# Patient Record
Sex: Female | Born: 1985 | Race: White | Hispanic: No | Marital: Married | Smoking: Never smoker
Health system: Southern US, Community
[De-identification: ages and names within clinical notes are randomized; demographics above are authoritative.]

## PROBLEM LIST (undated history)

## (undated) ENCOUNTER — Inpatient Hospital Stay: Payer: Self-pay

## (undated) DIAGNOSIS — N83201 Unspecified ovarian cyst, right side: Secondary | ICD-10-CM

## (undated) HISTORY — DX: Unspecified ovarian cyst, right side: N83.201

---

## 2011-07-21 ENCOUNTER — Encounter (HOSPITAL_COMMUNITY): Payer: Self-pay | Admitting: Anesthesiology

## 2011-07-21 ENCOUNTER — Encounter (HOSPITAL_COMMUNITY)
Admission: AD | Disposition: A | Discharge status: Home / Self Care | Payer: Self-pay | Source: Ambulatory Visit | Attending: Obstetrics and Gynecology

## 2011-07-21 ENCOUNTER — Inpatient Hospital Stay (HOSPITAL_COMMUNITY): Payer: BC Managed Care – PPO | Admitting: Anesthesiology

## 2011-07-21 ENCOUNTER — Encounter (HOSPITAL_COMMUNITY): Payer: Self-pay | Admitting: *Deleted

## 2011-07-21 ENCOUNTER — Inpatient Hospital Stay (HOSPITAL_COMMUNITY)
Admission: AD | Admit: 2011-07-21 | Discharge: 2011-07-24 | DRG: 371 | Disposition: A | Discharge status: Home / Self Care | Payer: BC Managed Care – PPO | Source: Ambulatory Visit | Attending: Obstetrics and Gynecology | Admitting: Obstetrics and Gynecology

## 2011-07-21 DIAGNOSIS — Z98891 History of uterine scar from previous surgery: Secondary | ICD-10-CM

## 2011-07-21 DIAGNOSIS — O99892 Other specified diseases and conditions complicating childbirth: Secondary | ICD-10-CM | POA: Diagnosis present

## 2011-07-21 DIAGNOSIS — O324XX0 Maternal care for high head at term, not applicable or unspecified: Secondary | ICD-10-CM | POA: Diagnosis present

## 2011-07-21 DIAGNOSIS — Z22330 Carrier of Group B streptococcus: Secondary | ICD-10-CM

## 2011-07-21 LAB — CBC
HCT: 35.8 % — ABNORMAL LOW (ref 36.0–46.0)
Hemoglobin: 11.6 g/dL — ABNORMAL LOW (ref 12.0–15.0)
MCHC: 32.4 g/dL (ref 30.0–36.0)
RBC: 4.32 MIL/uL (ref 3.87–5.11)

## 2011-07-21 LAB — ANTIBODY SCREEN: Antibody Screen: NEGATIVE

## 2011-07-21 LAB — RUBELLA ANTIBODY, IGM: Rubella: IMMUNE

## 2011-07-21 LAB — HEPATITIS B SURFACE ANTIGEN: Hepatitis B Surface Ag: NEGATIVE

## 2011-07-21 SURGERY — Surgical Case
Anesthesia: Regional | Site: Abdomen | Wound class: Clean Contaminated

## 2011-07-21 MED ORDER — IBUPROFEN 600 MG PO TABS: 600.0000 mg | ORAL_TABLET | Freq: Four times a day (QID) | ORAL | Status: DC | PRN

## 2011-07-21 MED ORDER — LACTATED RINGERS IV SOLN
INTRAVENOUS | Status: DC
  Administered 2011-07-21: 05:00:00 via INTRAVENOUS

## 2011-07-21 MED ORDER — OXYTOCIN 20 UNITS IN LACTATED RINGERS INFUSION - SIMPLE
1.0000 m[IU]/min | INTRAVENOUS | Status: DC
  Administered 2011-07-21: 2 m[IU]/min via INTRAVENOUS

## 2011-07-21 MED ORDER — KETOROLAC TROMETHAMINE 30 MG/ML IJ SOLN: 30.0000 mg | Freq: Four times a day (QID) | INTRAMUSCULAR | Status: AC | PRN

## 2011-07-21 MED ORDER — FENTANYL CITRATE 0.05 MG/ML IJ SOLN
INTRAMUSCULAR | Status: AC
Start: 2011-07-21 — End: 2011-07-21
  Filled 2011-07-21: qty 2

## 2011-07-21 MED ORDER — NALBUPHINE HCL 10 MG/ML IJ SOLN
5.0000 mg | INTRAMUSCULAR | Status: DC | PRN
  Filled 2011-07-21: qty 1

## 2011-07-21 MED ORDER — SENNOSIDES-DOCUSATE SODIUM 8.6-50 MG PO TABS
2.0000 | ORAL_TABLET | Freq: Every day | ORAL | Status: DC
  Administered 2011-07-23: 2 via ORAL

## 2011-07-21 MED ORDER — IBUPROFEN 600 MG PO TABS
600.0000 mg | ORAL_TABLET | Freq: Four times a day (QID) | ORAL | Status: DC | PRN
  Filled 2011-07-21 (×6): qty 1

## 2011-07-21 MED ORDER — MORPHINE SULFATE 0.5 MG/ML IJ SOLN
INTRAMUSCULAR | Status: AC
  Filled 2011-07-21: qty 10

## 2011-07-21 MED ORDER — ONDANSETRON HCL 4 MG/2ML IJ SOLN
4.0000 mg | INTRAMUSCULAR | Status: DC | PRN
  Administered 2011-07-21: 4 mg via INTRAVENOUS
  Filled 2011-07-21: qty 2

## 2011-07-21 MED ORDER — MAGNESIUM HYDROXIDE 400 MG/5ML PO SUSP: 30.0000 mL | ORAL | Status: DC | PRN

## 2011-07-21 MED ORDER — DIPHENHYDRAMINE HCL 50 MG/ML IJ SOLN: 12.5000 mg | INTRAMUSCULAR | Status: DC | PRN

## 2011-07-21 MED ORDER — LACTATED RINGERS IV SOLN
500.0000 mL | INTRAVENOUS | Status: DC | PRN
  Administered 2011-07-21: 1000 mL via INTRAVENOUS

## 2011-07-21 MED ORDER — OXYTOCIN 20 UNITS IN LACTATED RINGERS INFUSION - SIMPLE
125.0000 mL/h | INTRAVENOUS | Status: AC
  Administered 2011-07-21: 125 mL/h via INTRAVENOUS

## 2011-07-21 MED ORDER — OXYTOCIN 20 UNITS IN LACTATED RINGERS INFUSION - SIMPLE
INTRAVENOUS | Status: AC
  Filled 2011-07-21: qty 1000

## 2011-07-21 MED ORDER — KETOROLAC TROMETHAMINE 60 MG/2ML IM SOLN
60.0000 mg | Freq: Once | INTRAMUSCULAR | Status: AC | PRN
  Administered 2011-07-21: 60 mg via INTRAMUSCULAR

## 2011-07-21 MED ORDER — OXYCODONE-ACETAMINOPHEN 5-325 MG PO TABS
1.0000 | ORAL_TABLET | ORAL | Status: DC | PRN
  Administered 2011-07-22: 1 via ORAL
  Filled 2011-07-21 (×2): qty 1

## 2011-07-21 MED ORDER — PROMETHAZINE HCL 25 MG/ML IJ SOLN: 6.2500 mg | INTRAMUSCULAR | Status: DC | PRN

## 2011-07-21 MED ORDER — ONDANSETRON HCL 4 MG/2ML IJ SOLN: 4.0000 mg | Freq: Four times a day (QID) | INTRAMUSCULAR | Status: DC | PRN

## 2011-07-21 MED ORDER — MEPERIDINE HCL 25 MG/ML IJ SOLN: 6.2500 mg | INTRAMUSCULAR | Status: DC | PRN

## 2011-07-21 MED ORDER — CEFAZOLIN SODIUM 1-5 GM-% IV SOLN
INTRAVENOUS | Status: DC | PRN
  Administered 2011-07-21: 1 g via INTRAVENOUS

## 2011-07-21 MED ORDER — MORPHINE SULFATE (PF) 0.5 MG/ML IJ SOLN
INTRAMUSCULAR | Status: DC | PRN
  Administered 2011-07-21: 200 ug via INTRATHECAL

## 2011-07-21 MED ORDER — ONDANSETRON HCL 4 MG/2ML IJ SOLN
INTRAMUSCULAR | Status: DC | PRN
  Administered 2011-07-21: 4 mg via INTRAVENOUS

## 2011-07-21 MED ORDER — OXYCODONE-ACETAMINOPHEN 5-325 MG PO TABS: 2.0000 | ORAL_TABLET | ORAL | Status: DC | PRN

## 2011-07-21 MED ORDER — PENICILLIN G POTASSIUM 5000000 UNITS IJ SOLR
5.0000 10*6.[IU] | Freq: Once | INTRAVENOUS | Status: AC
  Administered 2011-07-21: 5 10*6.[IU] via INTRAVENOUS
  Filled 2011-07-21: qty 5

## 2011-07-21 MED ORDER — KETOROLAC TROMETHAMINE 30 MG/ML IJ SOLN: 15.0000 mg | Freq: Once | INTRAMUSCULAR | Status: DC | PRN

## 2011-07-21 MED ORDER — MEASLES, MUMPS & RUBELLA VAC ~~LOC~~ INJ
0.5000 mL | INJECTION | Freq: Once | SUBCUTANEOUS | Status: DC
  Filled 2011-07-21: qty 0.5

## 2011-07-21 MED ORDER — LIDOCAINE HCL (PF) 1 % IJ SOLN
30.0000 mL | INTRAMUSCULAR | Status: DC | PRN
  Filled 2011-07-21: qty 30

## 2011-07-21 MED ORDER — SIMETHICONE 80 MG PO CHEW: 80.0000 mg | CHEWABLE_TABLET | ORAL | Status: DC | PRN

## 2011-07-21 MED ORDER — BUPIVACAINE IN DEXTROSE 0.75-8.25 % IT SOLN
INTRATHECAL | Status: DC | PRN
  Administered 2011-07-21: 1.6 mL via INTRATHECAL

## 2011-07-21 MED ORDER — DIBUCAINE 1 % RE OINT: 1.0000 "application " | TOPICAL_OINTMENT | RECTAL | Status: DC | PRN

## 2011-07-21 MED ORDER — PHENYLEPHRINE 40 MCG/ML (10ML) SYRINGE FOR IV PUSH (FOR BLOOD PRESSURE SUPPORT)
PREFILLED_SYRINGE | INTRAVENOUS | Status: AC
  Filled 2011-07-21: qty 5

## 2011-07-21 MED ORDER — METOCLOPRAMIDE HCL 5 MG/ML IJ SOLN
10.0000 mg | Freq: Three times a day (TID) | INTRAMUSCULAR | Status: DC | PRN
  Administered 2011-07-21: 10 mg via INTRAVENOUS
  Filled 2011-07-21: qty 2

## 2011-07-21 MED ORDER — ONDANSETRON HCL 4 MG/2ML IJ SOLN: 4.0000 mg | Freq: Three times a day (TID) | INTRAMUSCULAR | Status: DC | PRN

## 2011-07-21 MED ORDER — ONDANSETRON HCL 4 MG/2ML IJ SOLN
INTRAMUSCULAR | Status: AC
  Filled 2011-07-21: qty 2

## 2011-07-21 MED ORDER — ONDANSETRON HCL 4 MG PO TABS: 4.0000 mg | ORAL_TABLET | ORAL | Status: DC | PRN

## 2011-07-21 MED ORDER — PENICILLIN G POTASSIUM 5000000 UNITS IJ SOLR
2.5000 10*6.[IU] | INTRAVENOUS | Status: DC
  Filled 2011-07-21 (×3): qty 2.5

## 2011-07-21 MED ORDER — NALOXONE HCL 0.4 MG/ML IJ SOLN: 0.4000 mg | INTRAMUSCULAR | Status: DC | PRN

## 2011-07-21 MED ORDER — NALOXONE HCL 0.4 MG/ML IJ SOLN
INTRAMUSCULAR | Status: AC
  Filled 2011-07-21: qty 1

## 2011-07-21 MED ORDER — DIPHENHYDRAMINE HCL 50 MG/ML IJ SOLN: 25.0000 mg | INTRAMUSCULAR | Status: DC | PRN

## 2011-07-21 MED ORDER — TETANUS-DIPHTH-ACELL PERTUSSIS 5-2.5-18.5 LF-MCG/0.5 IM SUSP: 0.5000 mL | Freq: Once | INTRAMUSCULAR | Status: DC

## 2011-07-21 MED ORDER — OXYTOCIN 20 UNITS IN LACTATED RINGERS INFUSION - SIMPLE
INTRAVENOUS | Status: DC | PRN
  Administered 2011-07-21: 20 [IU] via INTRAVENOUS

## 2011-07-21 MED ORDER — SIMETHICONE 80 MG PO CHEW
80.0000 mg | CHEWABLE_TABLET | Freq: Three times a day (TID) | ORAL | Status: DC
  Administered 2011-07-23 – 2011-07-24 (×3): 80 mg via ORAL

## 2011-07-21 MED ORDER — TERBUTALINE SULFATE 1 MG/ML IJ SOLN: 0.2500 mg | Freq: Once | INTRAMUSCULAR | Status: DC | PRN

## 2011-07-21 MED ORDER — WITCH HAZEL-GLYCERIN EX PADS
1.0000 | MEDICATED_PAD | CUTANEOUS | Status: DC | PRN
Start: 2011-07-21 — End: 2011-07-24

## 2011-07-21 MED ORDER — FLEET ENEMA 7-19 GM/118ML RE ENEM: 1.0000 | ENEMA | RECTAL | Status: DC | PRN

## 2011-07-21 MED ORDER — MENTHOL 3 MG MT LOZG: 1.0000 | LOZENGE | OROMUCOSAL | Status: DC | PRN

## 2011-07-21 MED ORDER — DIPHENHYDRAMINE HCL 25 MG PO CAPS: 25.0000 mg | ORAL_CAPSULE | Freq: Four times a day (QID) | ORAL | Status: DC | PRN

## 2011-07-21 MED ORDER — BUTORPHANOL TARTRATE 2 MG/ML IJ SOLN: 1.0000 mg | INTRAMUSCULAR | Status: DC | PRN

## 2011-07-21 MED ORDER — DIPHENHYDRAMINE HCL 25 MG PO CAPS: 25.0000 mg | ORAL_CAPSULE | ORAL | Status: DC | PRN

## 2011-07-21 MED ORDER — KETOROLAC TROMETHAMINE 60 MG/2ML IM SOLN
INTRAMUSCULAR | Status: AC
  Administered 2011-07-21: 60 mg via INTRAMUSCULAR
  Filled 2011-07-21: qty 2

## 2011-07-21 MED ORDER — PRENATAL MULTIVITAMIN CH
1.0000 | ORAL_TABLET | Freq: Every day | ORAL | Status: DC
  Administered 2011-07-22 – 2011-07-24 (×3): 1 via ORAL
  Filled 2011-07-21 (×3): qty 1

## 2011-07-21 MED ORDER — SODIUM CHLORIDE 0.9 % IJ SOLN: 3.0000 mL | INTRAMUSCULAR | Status: DC | PRN

## 2011-07-21 MED ORDER — SODIUM CHLORIDE 0.9 % IV SOLN: 1.0000 ug/kg/h | INTRAVENOUS | Status: DC | PRN

## 2011-07-21 MED ORDER — OXYTOCIN 10 UNIT/ML IJ SOLN
INTRAMUSCULAR | Status: AC
  Filled 2011-07-21: qty 2

## 2011-07-21 MED ORDER — OXYTOCIN BOLUS FROM INFUSION
500.0000 mL | Freq: Once | INTRAVENOUS | Status: DC
  Filled 2011-07-21: qty 500

## 2011-07-21 MED ORDER — CITRIC ACID-SODIUM CITRATE 334-500 MG/5ML PO SOLN
30.0000 mL | ORAL | Status: DC | PRN
  Administered 2011-07-21: 30 mL via ORAL
  Filled 2011-07-21 (×2): qty 15

## 2011-07-21 MED ORDER — FENTANYL CITRATE 0.05 MG/ML IJ SOLN
INTRAMUSCULAR | Status: DC | PRN
  Administered 2011-07-21: 25 ug via INTRATHECAL

## 2011-07-21 MED ORDER — SCOPOLAMINE 1 MG/3DAYS TD PT72
1.0000 | MEDICATED_PATCH | Freq: Once | TRANSDERMAL | Status: AC
  Administered 2011-07-21: 1.5 mg via TRANSDERMAL

## 2011-07-21 MED ORDER — ACETAMINOPHEN 325 MG PO TABS: 650.0000 mg | ORAL_TABLET | ORAL | Status: DC | PRN

## 2011-07-21 MED ORDER — OXYTOCIN 20 UNITS IN LACTATED RINGERS INFUSION - SIMPLE
125.0000 mL/h | Freq: Once | INTRAVENOUS | Status: DC
  Filled 2011-07-21: qty 1000

## 2011-07-21 MED ORDER — HYDROMORPHONE HCL PF 1 MG/ML IJ SOLN: 0.2500 mg | INTRAMUSCULAR | Status: DC | PRN

## 2011-07-21 MED ORDER — LACTATED RINGERS IV SOLN
INTRAVENOUS | Status: DC | PRN
  Administered 2011-07-21 (×3): via INTRAVENOUS

## 2011-07-21 MED ORDER — ZOLPIDEM TARTRATE 5 MG PO TABS: 5.0000 mg | ORAL_TABLET | Freq: Every evening | ORAL | Status: DC | PRN

## 2011-07-21 MED ORDER — IBUPROFEN 600 MG PO TABS
600.0000 mg | ORAL_TABLET | Freq: Four times a day (QID) | ORAL | Status: DC
  Administered 2011-07-22 – 2011-07-24 (×10): 600 mg via ORAL
  Filled 2011-07-21 (×5): qty 1

## 2011-07-21 MED ORDER — SCOPOLAMINE 1 MG/3DAYS TD PT72
MEDICATED_PATCH | TRANSDERMAL | Status: AC
  Administered 2011-07-21: 1.5 mg via TRANSDERMAL
  Filled 2011-07-21: qty 1

## 2011-07-21 MED ORDER — LACTATED RINGERS IV SOLN
INTRAVENOUS | Status: DC
  Administered 2011-07-21: 21:00:00 via INTRAVENOUS

## 2011-07-21 MED ORDER — PHENYLEPHRINE HCL 10 MG/ML IJ SOLN
INTRAMUSCULAR | Status: DC | PRN
  Administered 2011-07-21 (×2): 80 ug via INTRAVENOUS

## 2011-07-21 MED ORDER — 0.9 % SODIUM CHLORIDE (POUR BTL) OPTIME
TOPICAL | Status: DC | PRN
  Administered 2011-07-21: 1000 mL

## 2011-07-21 MED ORDER — LANOLIN HYDROUS EX OINT
1.0000 | TOPICAL_OINTMENT | CUTANEOUS | Status: DC | PRN
Start: 2011-07-21 — End: 2011-07-24

## 2011-07-21 MED ORDER — CEFAZOLIN SODIUM 1-5 GM-% IV SOLN
INTRAVENOUS | Status: AC
  Filled 2011-07-21: qty 50

## 2011-07-21 SURGICAL SUPPLY — 37 items
BENZOIN TINCTURE PRP APPL 2/3 (GAUZE/BANDAGES/DRESSINGS) ×2 IMPLANT
CHLORAPREP W/TINT 26ML (MISCELLANEOUS) ×2 IMPLANT
CLOSURE STERI STRIP 1/2 X4 (GAUZE/BANDAGES/DRESSINGS) ×2 IMPLANT
CLOTH BEACON ORANGE TIMEOUT ST (SAFETY) ×2 IMPLANT
CONTAINER PREFILL 10% NBF 15ML (MISCELLANEOUS) IMPLANT
DRSG COVADERM 4X10 (GAUZE/BANDAGES/DRESSINGS) ×2 IMPLANT
ELECT REM PT RETURN 9FT ADLT (ELECTROSURGICAL) ×2
ELECTRODE REM PT RTRN 9FT ADLT (ELECTROSURGICAL) ×1 IMPLANT
EXTRACTOR VACUUM KIWI (MISCELLANEOUS) IMPLANT
EXTRACTOR VACUUM M CUP 4 TUBE (SUCTIONS) IMPLANT
GLOVE BIO SURGEON STRL SZ8 (GLOVE) ×2 IMPLANT
GLOVE BIOGEL PI IND STRL 7.5 (GLOVE) ×2 IMPLANT
GLOVE BIOGEL PI INDICATOR 7.5 (GLOVE) ×2
GLOVE ECLIPSE 6.5 STRL STRAW (GLOVE) ×2 IMPLANT
GLOVE ECLIPSE 7.0 STRL STRAW (GLOVE) ×4 IMPLANT
GLOVE ORTHO TXT STRL SZ7.5 (GLOVE) ×4 IMPLANT
GOWN PREVENTION PLUS LG XLONG (DISPOSABLE) ×4 IMPLANT
GOWN SURGICAL XLG (GOWNS) ×4 IMPLANT
KIT ABG SYR 3ML LUER SLIP (SYRINGE) IMPLANT
NEEDLE HYPO 25X5/8 SAFETYGLIDE (NEEDLE) ×2 IMPLANT
NS IRRIG 1000ML POUR BTL (IV SOLUTION) ×2 IMPLANT
PACK C SECTION WH (CUSTOM PROCEDURE TRAY) ×2 IMPLANT
RTRCTR C-SECT PINK 25CM LRG (MISCELLANEOUS) ×2 IMPLANT
SLEEVE SCD COMPRESS KNEE MED (MISCELLANEOUS) ×2 IMPLANT
STAPLER VISISTAT 35W (STAPLE) IMPLANT
STRIP CLOSURE SKIN 1/2X4 (GAUZE/BANDAGES/DRESSINGS) ×2 IMPLANT
SUT CHROMIC 1 CTX 36 (SUTURE) ×4 IMPLANT
SUT PLAIN 0 NONE (SUTURE) IMPLANT
SUT PLAIN 2 0 XLH (SUTURE) ×2 IMPLANT
SUT PROLENE 4 0 KS NEEDLE (SUTURE) IMPLANT
SUT VIC AB 0 CT1 27 (SUTURE) ×3
SUT VIC AB 0 CT1 27XBRD ANBCTR (SUTURE) ×3 IMPLANT
SUT VIC AB 4-0 KS 27 (SUTURE) ×2 IMPLANT
SWABSTICK BENZOIN STERILE (MISCELLANEOUS) ×2 IMPLANT
TOWEL OR 17X24 6PK STRL BLUE (TOWEL DISPOSABLE) ×4 IMPLANT
TRAY FOLEY CATH 14FR (SET/KITS/TRAYS/PACK) ×2 IMPLANT
WATER STERILE IRR 1000ML POUR (IV SOLUTION) ×2 IMPLANT

## 2011-07-21 NOTE — Consult Note (Signed)
Neonatology Note:   Attendance at C-section:    I was asked to attend this primary C/S at term due to failure of descent. The mother is a G2P0A1 A pos, GBS positive who has remained afebrile during labor and received 2 doses of Pen G prior to delivery. ROM about 6 hours prior to delivery, fluid clear. Infant vigorous with good spontaneous cry and tone. Needed only minimal bulb suctioning. Ap 8/9. Lungs clear to ausc in DR. To CN to care of Pediatrician.   Deatra James, MD

## 2011-07-21 NOTE — Anesthesia Postprocedure Evaluation (Signed)
Anesthesia Post Note  Patient: Jessica Frederick  Procedure(s) Performed:  CESAREAN SECTION - Primary cesarean section  Anesthesia type: Spinal  Patient location: PACU  Post pain: Pain level controlled  Post assessment: Post-op Vital signs reviewed  Last Vitals:  Filed Vitals:   07/21/11 1230  BP: 90/52  Pulse: 67  Temp:   Resp: 15    Post vital signs: Reviewed  Level of consciousness: awake  Complications: No apparent anesthesia complications

## 2011-07-21 NOTE — Progress Notes (Signed)
Pushing for about 2 hrs, getting tired Afeb, VSS FHT- Cat II, variables with ctx VE- C/C/+1, caput at +2, vtx Discussed that vtx has not descended enough for me to offer assistance, has been pushing for 2 hrs, recommended c-section for arrest of descent.  Discussed c-section procedure and risks.  Will do VE after has spinal to see if assisted vaginal delivery is an option.

## 2011-07-21 NOTE — Anesthesia Procedure Notes (Signed)
Spinal  Patient location during procedure: OR Start time: 07/21/2011 10:54 AM End time: 07/21/2011 10:58 AM Staffing Anesthesiologist: Sandrea Hughs Performed by: anesthesiologist  Preanesthetic Checklist Completed: patient identified, site marked, surgical consent, pre-op evaluation, timeout performed, IV checked, risks and benefits discussed and monitors and equipment checked Spinal Block Patient position: sitting Prep: DuraPrep Patient monitoring: heart rate, cardiac monitor, continuous pulse ox and blood pressure Approach: midline Location: L3-4 Injection technique: single-shot Needle Needle type: Sprotte  Needle gauge: 24 G Needle length: 9 cm Needle insertion depth: 8 cm Assessment Sensory level: T4

## 2011-07-21 NOTE — H&P (Signed)
Jessica Frederick is a 25 y.o. female G2P0010 at 39+ weeks (EDD 07/28/11 by 9 week [place]) presenting for SROM at 0330 am and started on pitocin for augmentation.  Prenatal care uneventful except for GBS positive.  Maternal Medical History:  Reason for admission: Reason for admission: rupture of membranes.  Contractions: Onset was 3-5 hours ago.   Frequency: regular.   Perceived severity is moderate.    Fetal activity: Perceived fetal activity is normal.      Past Ob HX SAB x 1  History reviewed. No pertinent past medical history. History reviewed. No pertinent past surgical history. Family History: family history is not on file. Social History:  reports that she has never smoked. She does not have any smokeless tobacco history on file. She reports that she does not drink alcohol or use illicit drugs.  ROS  Dilation: 4.5 Effacement (%): 90 Station: -1 Exam by:: Dr. Senaida Ores Blood pressure 127/85, pulse 97, temperature 97.8 F (36.6 C), temperature source Oral, resp. rate 18, height 5' (1.524 m), weight 94.348 kg (208 lb). Maternal Exam:  Uterine Assessment: Contraction strength is moderate.  Contraction frequency is regular.   Abdomen: Patient reports no abdominal tenderness. Fetal presentation: vertex  Introitus: Normal vulva. Normal vagina.    Physical Exam  Constitutional: She is oriented to person, place, and time. She appears well-developed and well-nourished.  Cardiovascular: Normal rate and regular rhythm.   Respiratory: Effort normal and breath sounds normal.  GI: Soft. Bowel sounds are normal.  Genitourinary: Vagina normal and uterus normal.       Cervix c/4-5/-1  Neurological: She is alert and oriented to person, place, and time.  Psychiatric: She has a normal mood and affect. Her behavior is normal.    Prenatal labs: ABO, Rh: A/Positive/-- (12/26 0000) Antibody: Negative (12/26 0000) Rubella: Immune (12/26 0000) RPR: Nonreactive (12/26 0000)  HBsAg: Negative  (12/26 0000)  HIV: Non-reactive (12/26 0000)  GBS: Positive (12/26 0000)  One hour glucola 126 Declined genetic screens  Assessment/Plan: Pt being augmented with pitocin, has progressed from 3cm to 4-5 cm and may do an epidural in the next hour.  On PCN for +GBS, has received first dose.  Oliver Pila 07/21/2011, 6:40 AM

## 2011-07-21 NOTE — Addendum Note (Signed)
Addendum  created 07/21/11 1939 by Tyrone Apple. Malen Gauze, MD   Modules edited:Orders

## 2011-07-21 NOTE — Progress Notes (Signed)
Pt may go to room 173.

## 2011-07-21 NOTE — Progress Notes (Signed)
Pt presents to mau with c/o ROM.  States large gush of fluid at home, clear.  Denies any bleeding.

## 2011-07-21 NOTE — Anesthesia Postprocedure Evaluation (Signed)
  Anesthesia Post Note  Patient: Jessica Frederick  Procedure(s) Performed:  CESAREAN SECTION - Primary cesarean section  Anesthesia type: Spinal  Patient location: Mother/Baby  Post pain: Pain level controlled  Post assessment: Post-op Vital signs reviewed  Last Vitals:  Filed Vitals:   07/21/11 1606  BP: 111/72  Pulse: 74  Temp: 36.7 C  Resp: 16    Post vital signs: Reviewed  Level of consciousness: awake  Complications: No apparent anesthesia complications

## 2011-07-21 NOTE — Op Note (Signed)
Preoperative diagnosis: Intrauterine pregnancy at 39 weeks, arrest of descent Postoperative diagnosis: Same Procedure: Primary low transverse cesarean section without extensions Surgeon: Lavina Hamman M.D. Anesthesia: Epidural Findings: Patient had normal gravid anatomy and delivered a viable female infant with Apgars of 8 and 9 weighing 7 lbs. 6 oz. Estimated blood loss: 800 cc Specimens: Placenta sent to labor and delivery Complications: None  Procedure in detail: The patient was taken to the operating room and placed in the sitting position. Dr. Arby Barrette instilled spinal anesthesia. Abdomen was then prepped and draped in the usual sterile fashion. A Foley catheter was placed. The level of her anesthesia was found to be adequate. Abdomen was entered via a standard Pfannenstiel incision. Once the peritoneal cavity was entered the Alexis disposable self-retaining retractor was placed and good visualization was achieved. A 4 cm transverse incision was then made in the lower uterine segment pushing the bladder inferior. Once the uterine cavity was entered the incision was extended digitally. The fetal vertex was grasped and delivered through the incision atraumatically. Mouth and nares were suctioned. The remainder of the infant then delivered atraumatically. Cord was doubly clamped and cut and the infant handed to the awaiting pediatric team. Cord blood was obtained. The placenta was removed manually. Uterus was wiped dry with clean lap pad and all clots and debris were removed. Uterine incision was inspected and found to be free of extensions. Uterine incision was closed in 2 layers with the first layer being a running locking layer with #1 chromic and the second layer being an imbricating layer also with #1 chromic. Tubes and ovaries were inspected and found to be normal. Uterine incision was inspected and found to be hemostatic. Bleeding from serosal edges was controlled with electrocautery. The Alexis  retractor was removed. Subfascial space was irrigated and made hemostatic with electrocautery. Fascia was closed in running fashion starting at both ends and meeting in the middle with 0 Vicryl. Subcutaneous tissue was then irrigated and made hemostatic with electrocautery. Subcutaneous tissue was closed with running 2-0 plain gut.  Skin was closed with running subcuticular suture with 4-0 Vicryl followed by a sterile dressing. Patient tolerated the procedure well and was taken to the recovery in stable condition. Counts were correct x2, she received Ancef 1 g IV at the beginning of the procedure and she had PAS hose on throughout the procedure.

## 2011-07-21 NOTE — Progress Notes (Signed)
Pushing FHT- Cat II- some variable decels with ctx VE- C/C/+1 Continue pushing, anticipate SVD

## 2011-07-21 NOTE — Anesthesia Preprocedure Evaluation (Addendum)
Anesthesia Evaluation  Patient identified by MRN, date of birth, ID band Patient awake    Reviewed: Allergy & Precautions, H&P , NPO status , Patient's Chart, lab work & pertinent test results  Airway Mallampati: II TM Distance: >3 FB Neck ROM: full    Dental No notable dental hx.    Pulmonary neg pulmonary ROS,    Pulmonary exam normal       Cardiovascular neg cardio ROS regular Normal    Neuro/Psych Negative Neurological ROS  Negative Psych ROS   GI/Hepatic negative GI ROS, Neg liver ROS,   Endo/Other  Negative Endocrine ROS  Renal/GU negative Renal ROS  Genitourinary negative   Musculoskeletal negative musculoskeletal ROS (+)   Abdominal (+) obese,   Peds negative pediatric ROS (+)  Hematology negative hematology ROS (+)   Anesthesia Other Findings   Reproductive/Obstetrics (+) Pregnancy                           Anesthesia Physical Anesthesia Plan  ASA: III and Emergent  Anesthesia Plan: Spinal   Post-op Pain Management:    Induction:   Airway Management Planned:   Additional Equipment:   Intra-op Plan:   Post-operative Plan:   Informed Consent: I have reviewed the patients History and Physical, chart, labs and discussed the procedure including the risks, benefits and alternatives for the proposed anesthesia with the patient or authorized representative who has indicated his/her understanding and acceptance.     Plan Discussed with: CRNA  Anesthesia Plan Comments:        Anesthesia Quick Evaluation

## 2011-07-21 NOTE — Addendum Note (Signed)
Addendum  created 07/21/11 1625 by Cephus Shelling   Modules edited:Notes Section

## 2011-07-21 NOTE — Transfer of Care (Signed)
Immediate Anesthesia Transfer of Care Note  Patient: Jessica Frederick  Procedure(s) Performed:  CESAREAN SECTION - Primary cesarean section  Patient Location: PACU  Anesthesia Type: Regional  Level of Consciousness: awake and alert   Airway & Oxygen Therapy: Patient Spontanous Breathing  Post-op Assessment: Report given to PACU RN  Post vital signs: Reviewed and stable  Complications: No apparent anesthesia complications

## 2011-07-22 LAB — CBC
HCT: 26.4 % — ABNORMAL LOW (ref 36.0–46.0)
MCHC: 32.6 g/dL (ref 30.0–36.0)
MCV: 82.8 fL (ref 78.0–100.0)
Platelets: 184 10*3/uL (ref 150–400)
RDW: 13.5 % (ref 11.5–15.5)
WBC: 14.6 10*3/uL — ABNORMAL HIGH (ref 4.0–10.5)

## 2011-07-22 NOTE — Progress Notes (Signed)
Subjective: Postpartum Day 1: Cesarean Delivery Patient reports tolerating PO and no problems voiding.    Objective: Vital signs in last 24 hours: Temp:  [97.5 F (36.4 C)-98.7 F (37.1 C)] 98.3 F (36.8 C) (12/27 0345) Pulse Rate:  [67-90] 87  (12/27 0345) Resp:  [12-20] 18  (12/27 0345) BP: (90-128)/(46-79) 96/60 mmHg (12/27 0345) SpO2:  [95 %-97 %] 95 % (12/27 0345)  Physical Exam:  General: alert Lochia: appropriate Uterine Fundus: firm Incision: dressing C/D/I   Basename 07/22/11 0557 07/21/11 0500  HGB 8.6* 11.6*  HCT 26.4* 35.8*    Assessment/Plan: Status post Cesarean section. Doing well postoperatively.  Continue current care, ambulate.  Jessica Frederick D 07/22/2011, 8:38 AM

## 2011-07-23 NOTE — Progress Notes (Signed)
POD #2 Doing well, no problems Afeb, VSS Abd- soft, fundus firm, incision intact Continue routine care 

## 2011-07-24 ENCOUNTER — Encounter (HOSPITAL_COMMUNITY): Payer: Self-pay | Admitting: Obstetrics and Gynecology

## 2011-07-24 MED ORDER — OXYCODONE-ACETAMINOPHEN 5-325 MG PO TABS: 1.0000 | ORAL_TABLET | ORAL | Status: AC | PRN

## 2011-07-24 MED ORDER — IBUPROFEN 600 MG PO TABS: 600.0000 mg | ORAL_TABLET | Freq: Four times a day (QID) | ORAL | Status: AC | PRN

## 2011-07-24 NOTE — Progress Notes (Signed)
Subjective: Postpartum Day 3: Cesarean Delivery Patient reports tolerating PO, + flatus and no problems voiding.    Objective: Vital signs in last 24 hours: Temp:  [98.1 F (36.7 C)-98.2 F (36.8 C)] 98.2 F (36.8 C) (12/29 0535) Pulse Rate:  [98-109] 103  (12/29 0535) Resp:  [18-20] 18  (12/29 0535) BP: (112-121)/(71-82) 117/81 mmHg (12/29 0535)  Physical Exam:  General: alert Lochia: appropriate Uterine Fundus: firm Incision: healing well DVT Evaluation: No evidence of DVT seen on physical exam.   Basename 07/22/11 0557  HGB 8.6*  HCT 26.4*    Assessment/Plan: Status post Cesarean section. Doing well postoperatively.  Discharge home with standard precautions and return to office in 2 weeks for incision check. Motrin and percocet Essynce Munsch W 07/24/2011, 10:52 AM

## 2011-07-24 NOTE — Discharge Summary (Signed)
Obstetric Discharge Summary Reason for Admission: onset of labor and rupture of membranes Prenatal Procedures: none Intrapartum Procedures: cesarean: low cervical, transverse for arrest of descent Postpartum Procedures: none Complications-Operative and Postpartum: none Hemoglobin  Date Value Range Status  07/22/2011 8.6* 12.0-15.0 (g/dL) Final     DELTA CHECK NOTED     REPEATED TO VERIFY     HCT  Date Value Range Status  07/22/2011 26.4* 36.0-46.0 (%) Final    Discharge Diagnoses: Term Pregnancy-delivered  Discharge Information: Date: 07/24/2011 Activity: pelvic rest Diet: routine Medications: Ibuprofen and Percocet Condition: stable Instructions: refer to practice specific booklet Discharge to: home   Newborn Data: Live born female  Birth Weight: 7 lb 6.5 oz (3360 g) APGAR: 8, 9  Home with mother.  Oliver Pila 07/24/2011, 10:53 AM

## 2011-07-29 ENCOUNTER — Inpatient Hospital Stay (HOSPITAL_COMMUNITY): Admission: RE | Admit: 2011-07-29 | Payer: Self-pay | Source: Ambulatory Visit

## 2013-11-30 DIAGNOSIS — K219 Gastro-esophageal reflux disease without esophagitis: Secondary | ICD-10-CM | POA: Insufficient documentation

## 2014-02-18 ENCOUNTER — Ambulatory Visit: Payer: Self-pay | Admitting: Internal Medicine

## 2014-05-27 ENCOUNTER — Encounter (HOSPITAL_COMMUNITY): Payer: Self-pay | Admitting: Obstetrics and Gynecology

## 2014-09-19 LAB — HM PAP SMEAR: HM PAP: NEGATIVE

## 2014-11-10 IMAGING — CT CT ABD-PELV W/ CM
2 of 4 series · 17 of 46 positions shown, 19 images · IV contrast (omnipaque)
Comparison: None.

CLINICAL DATA: Right flank pain.  Evaluate for appendicitis

EXAM:
CT ABDOMEN AND PELVIS WITH CONTRAST
TECHNIQUE: Multidetector CT imaging of the abdomen and pelvis was performed
using the standard protocol following bolus administration of
intravenous contrast.
CONTRAST:  100 mL Omnipaque 300 IV

[Series 2: routine abd pel with · axial · 0.62mm/px · z∈[-854,-464]mm · 14 of 86 slices shown, 16 images]
[im 4/86  soft-tissue]
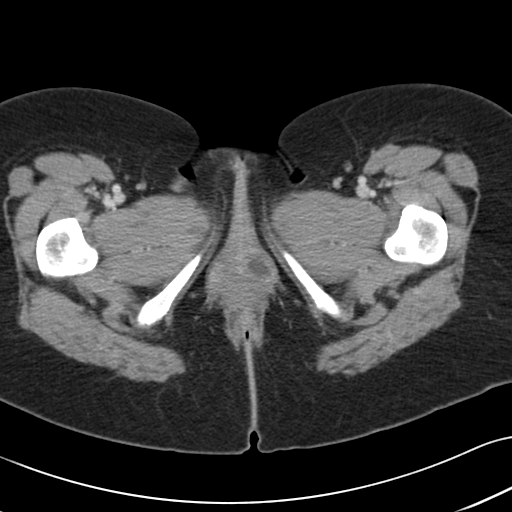
[im 4/86  bone]
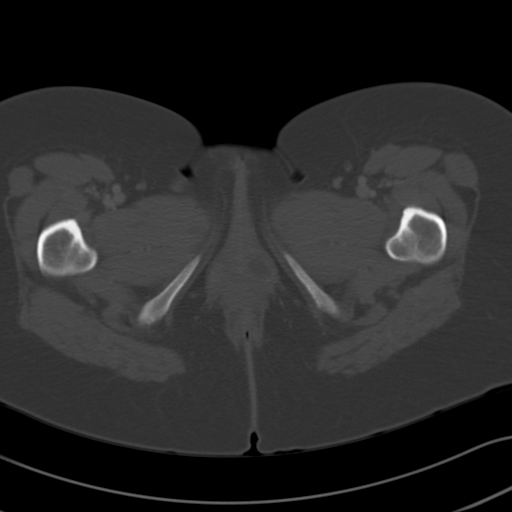
[im 11/86  soft-tissue]
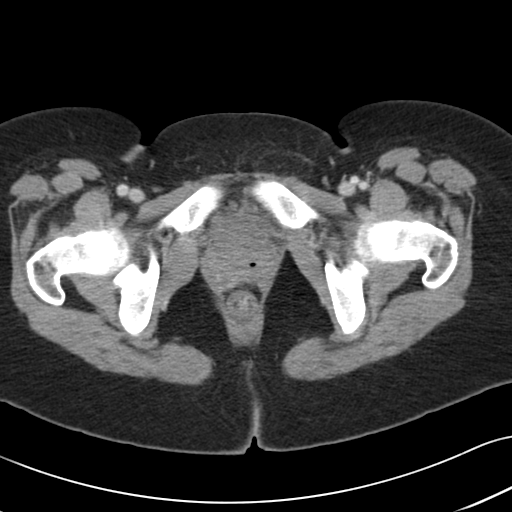
[im 18/86  soft-tissue]
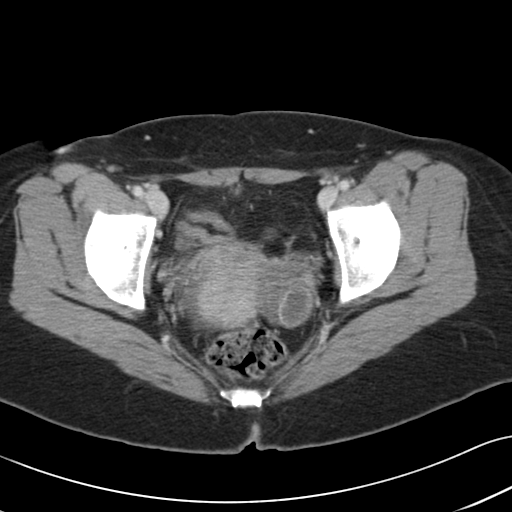
[im 22/86  soft-tissue]
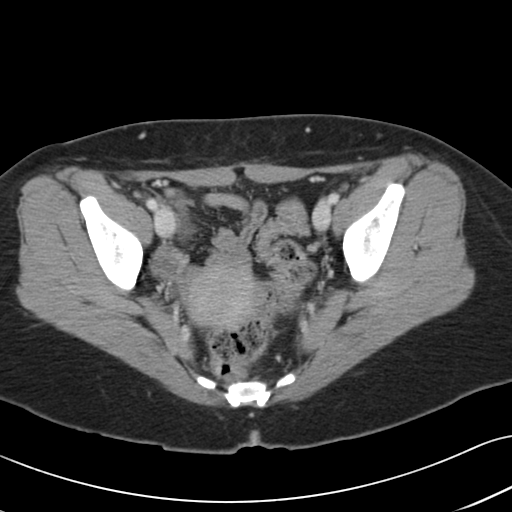
[im 29/86  soft-tissue]
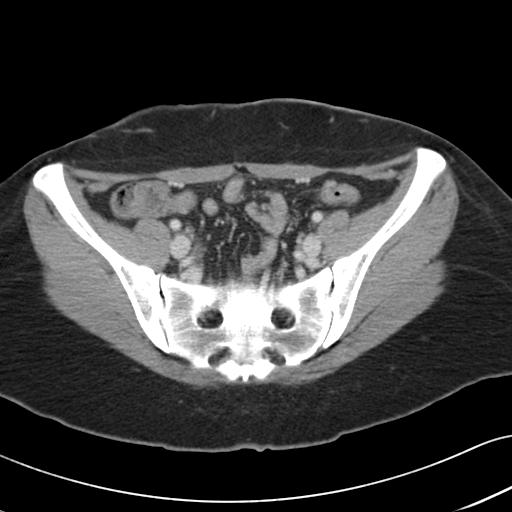
[im 36/86  soft-tissue]
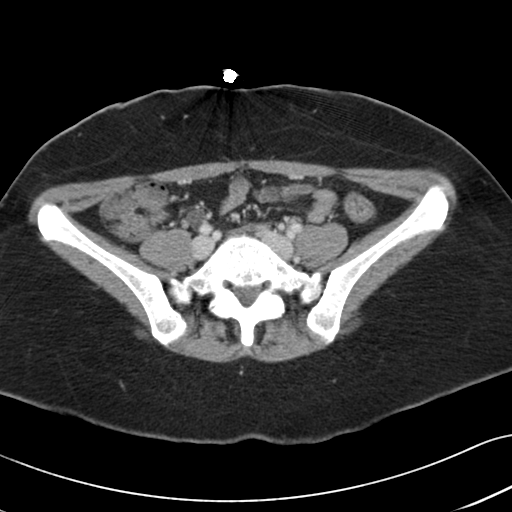
[im 39/86  soft-tissue]
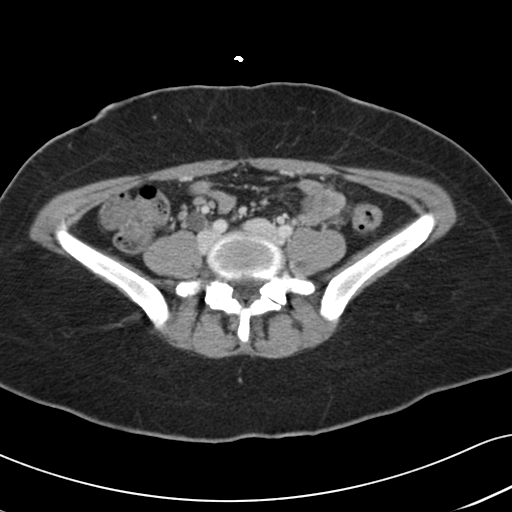
[im 47/86  soft-tissue]
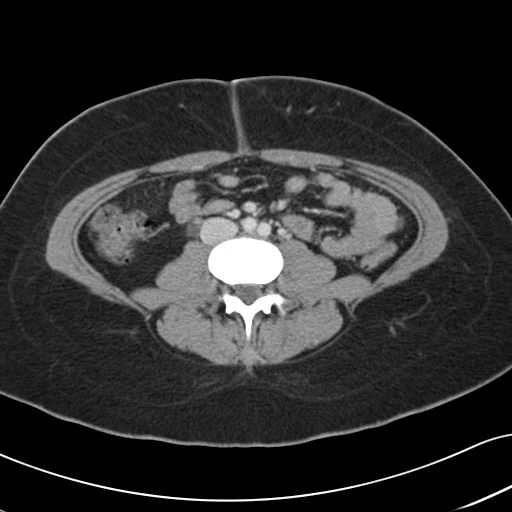
[im 50/86  soft-tissue]
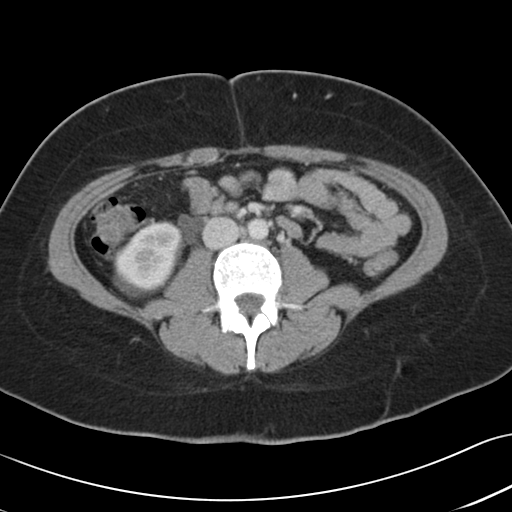
[im 50/86  bone]
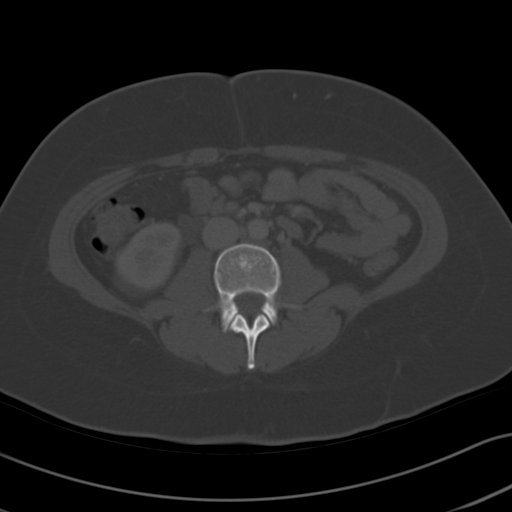
[im 57/86  soft-tissue]
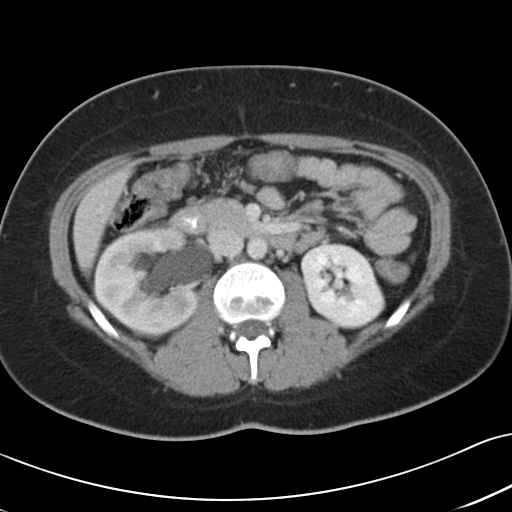
[im 64/86  soft-tissue]
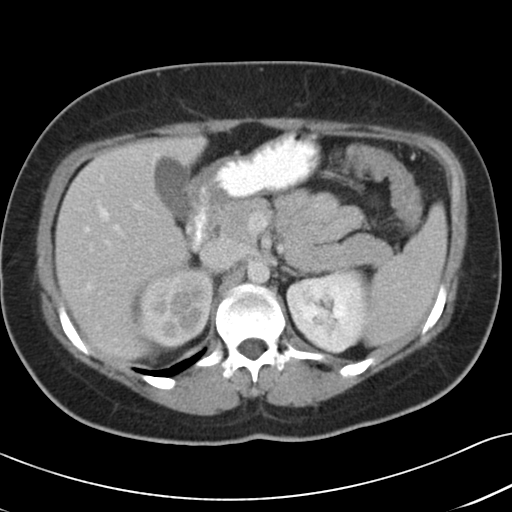
[im 68/86  soft-tissue]
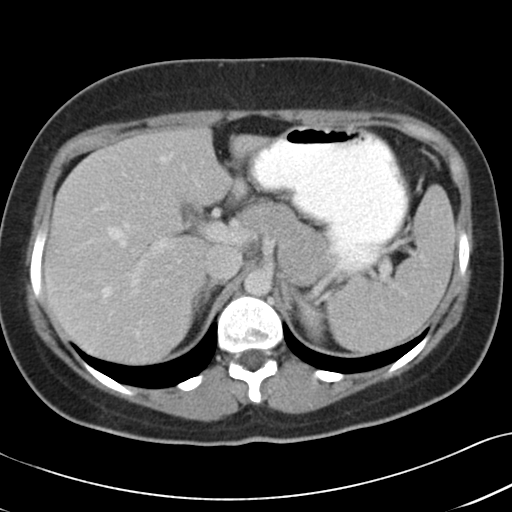
[im 75/86  soft-tissue]
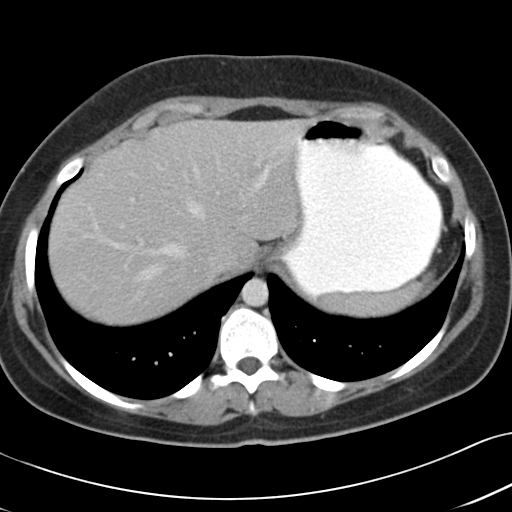
[im 82/86  soft-tissue]
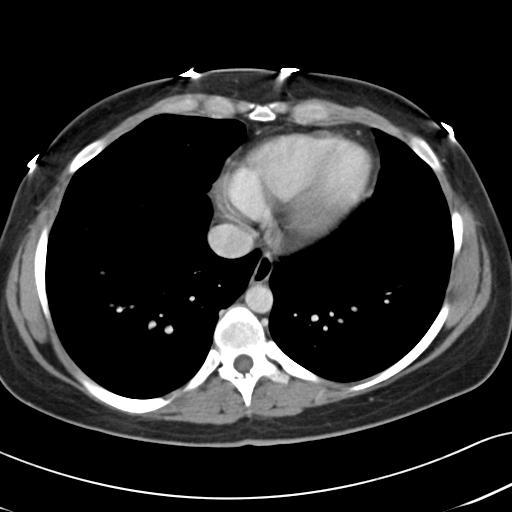

[Series 5: cor routine abd pel with · coronal · 0.73mm/px · 3 of 107 slices shown]
[im 36/107  soft-tissue]
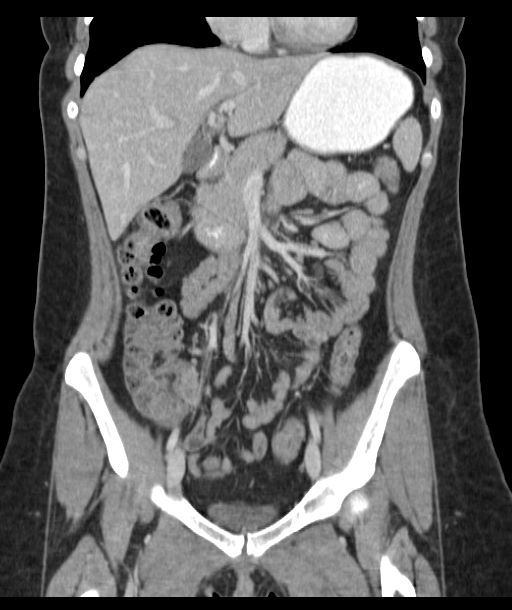
[im 48/107  soft-tissue]
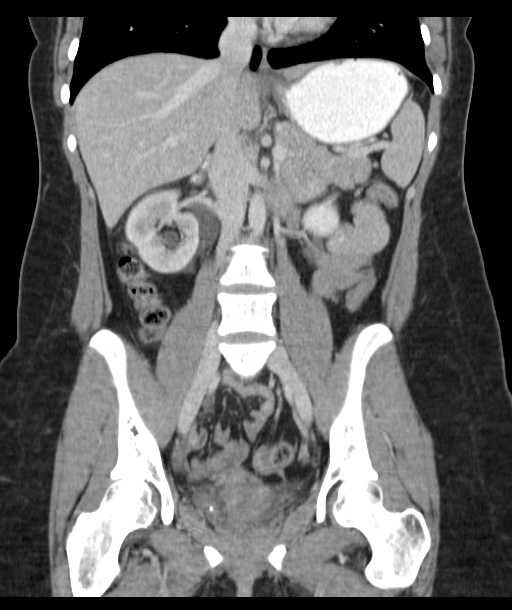
[im 59/107  soft-tissue]
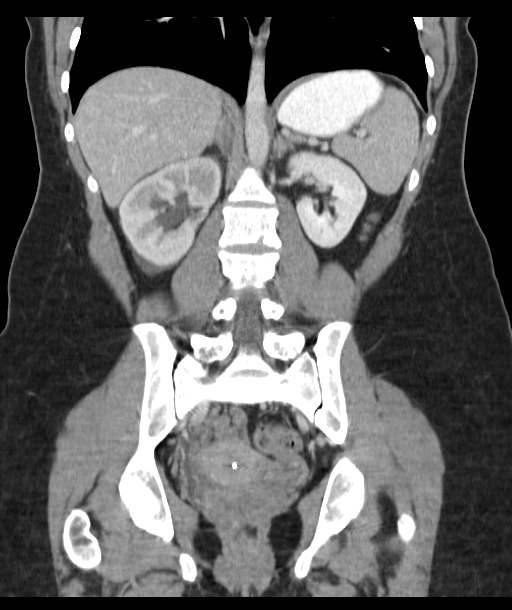

[17 of 46 positions shown; findings below may reference images not displayed]

FINDINGS: Lung bases are clear. Heart size is normal. No acute abnormality in
the lumbar spine.

Moderate right hydronephrosis and hydroureter. Ureter is dilated
down to the pelvis. There is a 2 mm stone in the distal right
ureter. Possible additional 3 mm stone in the distal right ureter
versus a phlebolith. No other renal calculi. No obstruction of the
left kidney.

Liver gallbladder and bile ducts are normal. Pancreas and spleen are
normal.

Negative for bowel obstruction or thickening. Normal appendix. No
mass or adenopathy.

Corpus luteum cyst left ovary.  IUD noted in the uterus.
IMPRESSION: Moderate right hydronephrosis and hydroureter. 2 mm calculus distal
right ureter. Possible additional 3 mm calculus versus phlebolith in
the distal right ureter.

Corpus luteum cyst left ovary

Normal appendix

## 2015-05-22 ENCOUNTER — Encounter: Payer: Self-pay | Admitting: *Deleted

## 2015-05-23 ENCOUNTER — Ambulatory Visit (INDEPENDENT_AMBULATORY_CARE_PROVIDER_SITE_OTHER): Payer: BLUE CROSS/BLUE SHIELD | Admitting: Obstetrics and Gynecology

## 2015-05-23 ENCOUNTER — Encounter: Payer: Self-pay | Admitting: Obstetrics and Gynecology

## 2015-05-23 VITALS — BP 119/82 | HR 91 | Ht 60.0 in | Wt 119.7 lb

## 2015-05-23 DIAGNOSIS — Z23 Encounter for immunization: Secondary | ICD-10-CM

## 2015-05-23 DIAGNOSIS — Z30432 Encounter for removal of intrauterine contraceptive device: Secondary | ICD-10-CM | POA: Diagnosis not present

## 2015-05-23 MED ORDER — INFLUENZA VAC SPLIT QUAD 0.5 ML IM SUSY
0.5000 mL | PREFILLED_SYRINGE | Freq: Once | INTRAMUSCULAR | Status: AC
  Administered 2015-05-23: 0.5 mL via INTRAMUSCULAR

## 2015-05-23 NOTE — Progress Notes (Signed)
Jessica Frederick is a 29 y.o. year old 731P1001 Caucasian female who presents for removal of a Mirena IUD. Her Mirena IUD was placed 2012 after delivery of first child.   Patient's last menstrual period was 05/01/2015 (exact date). BP 119/82 mmHg  Pulse 91  Ht 5' (1.524 m)  Wt 119 lb 11.2 oz (54.296 kg)  BMI 23.38 kg/m2  LMP 05/01/2015 (Exact Date)  Breastfeeding? No  Time out was performed.  A graves speculum was placed in the vagina.  The cervix was visualized, and the strings were visible. They were grasped and the Mirena was easily removed intact without complications.   F/U as needed  Jermany Rimel Elissa LovettN Burr, CNM

## 2015-07-27 NOTE — L&D Delivery Note (Signed)
Delivery Summary for Jessica Frederick  Labor Events:   Preterm labor:   Rupture date:   Rupture time:   Rupture type: Artificial  Fluid Color: Clear  Induction:   Augmentation:   Complications:   Cervical ripening:          Delivery:   Episiotomy:   Lacerations:   Repair suture:   Repair # of packets:   Blood loss (ml): 350   Information for the patient's newborn:  Jessica Frederick, Jessica Frederick [[id number]][[id number]]    Delivery 05/19/2016 7:04 PM by  Vaginal, Spontaneous Delivery Sex:  unspecified sex Gestational Age: 7360w1d Delivery Clinician:   Living?:         APGARS  One minute Five minutes Ten minutes  Skin color:        Heart rate:        Grimace:        Muscle tone:        Breathing:        Totals: 5  9      Presentation/position:      Resuscitation:   Cord information:    Disposition of cord blood:     Blood gases sent?  Complications:   Placenta: Delivered:       appearance Newborn Measurements: Weight: 6 lb 7.4 oz (2930 g)  Height: 19.29"  Head circumference:    Chest circumference:    Other providers:    Additional  information: Forceps:   Vacuum:   Breech:   Observed anomalies       Delivery Note At 7:04 PM a viable and healthy female was delivered via Vaginal, Spontaneous Delivery (Presentation: Vertex; LOA position).  APGAR: 5, 9; weight 6 lb 7.4 oz (2930 g).   Placenta status: spontaneously removed, intact.  Cord: 3-vessel with the following complications: nuchal cord x 1, non-reducible, clamped x 2 and cut.  Cord pH: not obtained.  Anesthesia: None Episiotomy: None Lacerations: 2nd degree;Perineal Suture Repair: 2.0 vicryl Est. Blood Loss (mL): 350  Mom to postpartum.  Baby to Couplet care / Skin to Skin.  Jessica Frederick 05/19/2016, 7:30 PM

## 2015-10-08 ENCOUNTER — Ambulatory Visit (INDEPENDENT_AMBULATORY_CARE_PROVIDER_SITE_OTHER): Payer: BLUE CROSS/BLUE SHIELD | Admitting: Obstetrics and Gynecology

## 2015-10-08 ENCOUNTER — Encounter: Payer: Self-pay | Admitting: Obstetrics and Gynecology

## 2015-10-08 VITALS — BP 112/74 | HR 88 | Ht 60.0 in | Wt 120.0 lb

## 2015-10-08 DIAGNOSIS — Z98891 History of uterine scar from previous surgery: Secondary | ICD-10-CM | POA: Diagnosis not present

## 2015-10-08 DIAGNOSIS — N926 Irregular menstruation, unspecified: Secondary | ICD-10-CM | POA: Diagnosis not present

## 2015-10-08 DIAGNOSIS — Z3201 Encounter for pregnancy test, result positive: Secondary | ICD-10-CM | POA: Diagnosis not present

## 2015-10-08 DIAGNOSIS — Z3490 Encounter for supervision of normal pregnancy, unspecified, unspecified trimester: Secondary | ICD-10-CM

## 2015-10-08 DIAGNOSIS — N912 Amenorrhea, unspecified: Secondary | ICD-10-CM | POA: Diagnosis not present

## 2015-10-08 LAB — POCT URINE PREGNANCY: PREG TEST UR: POSITIVE — AB

## 2015-10-08 NOTE — Progress Notes (Signed)
GYN ENCOUNTER NOTE  Subjective:       Jessica Frederick is a 30 y.o. 543P1011 female is here for gynecologic evaluation of the following issues:  1. Amenorrhea 2. Pregnancy confirmation.   3. Irregular menstrual cycle   Gynecologic History Patient's last menstrual period was 08/26/2015 (approximate). Contraception: none Last Pap: 08/2014. Results were: normal Last mammogram: none. Results were: N/A  Obstetric History OB History  Gravida Para Term Preterm AB SAB TAB Ectopic Multiple Living  3 1 1  1 1    1     # Outcome Date GA Lbr Len/2nd Weight Sex Delivery Anes PTL Lv  3 Current           2 Term 07/21/11 [id number] 05:55 / 03:39 7 lb 6.5 oz (3.36 kg) M CS-LTranv Spinal  Y  1 SAB 2012              Past Medical History  Diagnosis Date  . Right ovarian cyst     Past Surgical History  Procedure Laterality Date  . Cesarean section  07/21/2011    Procedure: CESAREAN SECTION;  Surgeon: Zenaida Nieceodd D Meisinger, MD;  Location: [facility];  Service: Gynecology;  Laterality: N/A;  Primary cesarean section    Current Outpatient Prescriptions on File Prior to Visit  Medication Sig Dispense Refill  . Prenatal Vit-Fe Fumarate-FA (PRENATAL MULTIVITAMIN) TABS Take 1 tablet by mouth daily.       No current facility-administered medications on file prior to visit.    No Known Allergies  Social History   Social History  . Marital Status: Married    Spouse Name: N/A  . Number of Children: N/A  . Years of Education: N/A   Occupational History  . Not on file.   Social History Main Topics  . Smoking status: Never Smoker   . Smokeless tobacco: Never Used  . Alcohol Use: No  . Drug Use: No  . Sexual Activity: Yes    Birth Control/ Protection: None     Comment: mirena   Other Topics Concern  . Not on file   Social History Narrative    Family History  Problem Relation Age of Onset  . Diabetes Mother   . Breast cancer Paternal Grandmother   . Colon cancer Neg Hx   . Ovarian cancer  Neg Hx   . Heart disease Neg Hx     The following portions of the patient's history were reviewed and updated as appropriate: allergies, current medications, past family history, past medical history, past social history, past surgical history and problem list.  Review of Systems Review of Systems - General ROS: negative for - chills, fatigue, fever, hot flashes, malaise or night sweats Hematological and Lymphatic ROS: negative for - bleeding problems or swollen lymph nodes Gastrointestinal ROS: negative for - abdominal pain, blood in stools, change in bowel habits and vomiting. Positive for mild morning nausea Musculoskeletal ROS: negative for - joint pain, muscle pain or muscular weakness. Positive for mild breast tenderness Genito-Urinary ROS: negative for - dysmenorrhea, dyspareunia, dysuria, genital discharge, genital ulcers, hematuria, incontinence, irregular/heavy menses, nocturia or pelvic pain. Positive for amenorrhea.  Objective:   BP 112/74 mmHg  Pulse 88  Ht 5' (1.524 m)  Wt 120 lb (54.432 kg)  BMI 23.44 kg/m2  LMP 08/26/2015 (Approximate) CONSTITUTIONAL: Well-developed, well-nourished female in no acute distress.   Physical exam deferred, will be performed at follow-up prenatal nursing visit.     Assessment:   1. Amenorrhea - POCT urine pregnancy  2. Irregular menstrual cycle: since IUD removal 04/2015; patient states cycles vary from 24 to 28 days in length 3.  History of prior C-section: patient pushed for 4 hours; did not descend; C-section performed for failure to descend.  Do not recommend trial of labor.  Patient is comfortable with repeat C-section at [redacted] weeks gestation.       Plan:   1.   Pelvic ultrasound to confirm fetal viability and estimated gestational age / date of delivery. 2.  Repeat cesarean section delivery at 39.[redacted] weeks gestation 3.  New OB counseling:  The patient has been given an overview regarding routine prenatal care.  Recommendations  regarding diet, weight gain, and exercise in pregnancy were given.  Prenatal testing, optional genetic testing, and ultrasound use in pregnancy were reviewed.   Benefits of Breast Feeding were discussed. The patient is encouraged to consider nursing her baby post partum. 4.  Return for nursing appt in 3 weeks 5.  New OB appt in 5 weeks  A total of 30 minutes were spent face-to-face with the patient during the encounter with greater than 50% dealing with counseling and coordination of care.   Octavia Heir, PA-S Herold Harms, MD   I have seen, interviewed, and examined the patient in conjunction with the [facility].A. student and affirm the diagnosis and management plan. Ulani Degrasse A. Liviana Mills, MD, FACOG   Note: This dictation was prepared with Dragon dictation along with smaller phrase technology. Any transcriptional errors that result from this process are unintentional.

## 2015-10-08 NOTE — Patient Instructions (Signed)
1. Ultrasound in 1 week to confirm fetal viability and dating 2. New OB nursing appointment in 3 weeks 3. New OB physical exam in 5 weeks

## 2015-10-15 ENCOUNTER — Ambulatory Visit (INDEPENDENT_AMBULATORY_CARE_PROVIDER_SITE_OTHER): Payer: BLUE CROSS/BLUE SHIELD

## 2015-10-15 DIAGNOSIS — Z3490 Encounter for supervision of normal pregnancy, unspecified, unspecified trimester: Secondary | ICD-10-CM

## 2015-10-15 DIAGNOSIS — N926 Irregular menstruation, unspecified: Secondary | ICD-10-CM | POA: Diagnosis not present

## 2015-10-15 DIAGNOSIS — Z331 Pregnant state, incidental: Secondary | ICD-10-CM

## 2015-10-23 ENCOUNTER — Telehealth: Payer: Self-pay | Admitting: Obstetrics and Gynecology

## 2015-10-23 ENCOUNTER — Other Ambulatory Visit: Payer: Self-pay | Admitting: *Deleted

## 2015-10-23 DIAGNOSIS — O219 Vomiting of pregnancy, unspecified: Secondary | ICD-10-CM

## 2015-10-23 MED ORDER — DOXYLAMINE-PYRIDOXINE 10-10 MG PO TBEC: DELAYED_RELEASE_TABLET | ORAL | Status: DC

## 2015-10-23 NOTE — Telephone Encounter (Signed)
Please send in diclegis

## 2015-10-23 NOTE — Telephone Encounter (Signed)
Pt notified that rx for Diclegis sent to pharmacy.

## 2015-10-23 NOTE — Telephone Encounter (Signed)
PT CALLED AND SHE IS OUT OF THE SAMPLES OF THE DICLEGIS THAT SHE WAS GIVEN AND WANTED TO KNOW IF YOU COULD CALL IN A RX FOR IT.

## 2015-10-28 ENCOUNTER — Ambulatory Visit (INDEPENDENT_AMBULATORY_CARE_PROVIDER_SITE_OTHER): Payer: BLUE CROSS/BLUE SHIELD | Admitting: Obstetrics and Gynecology

## 2015-10-28 VITALS — BP 113/80 | HR 88 | Wt 122.1 lb

## 2015-10-28 DIAGNOSIS — Z369 Encounter for antenatal screening, unspecified: Secondary | ICD-10-CM

## 2015-10-28 DIAGNOSIS — Z331 Pregnant state, incidental: Secondary | ICD-10-CM

## 2015-10-28 DIAGNOSIS — Z1389 Encounter for screening for other disorder: Secondary | ICD-10-CM

## 2015-10-28 DIAGNOSIS — Z36 Encounter for antenatal screening of mother: Secondary | ICD-10-CM

## 2015-10-28 DIAGNOSIS — Z3490 Encounter for supervision of normal pregnancy, unspecified, unspecified trimester: Secondary | ICD-10-CM

## 2015-10-28 DIAGNOSIS — Z113 Encounter for screening for infections with a predominantly sexual mode of transmission: Secondary | ICD-10-CM

## 2015-10-28 NOTE — Patient Instructions (Signed)
Pregnancy and Zika Virus Disease Zika virus disease, or Zika, is an illness that can spread to people from mosquitoes that carry the virus. It may also spread from person to person through infected body fluids. Zika first occurred in [place], but recently it has spread to new areas. The virus occurs in tropical climates. The location of Zika continues to change. Most people who become infected with Zika virus do not develop serious illness. However, Zika may cause birth defects in an unborn baby whose mother is infected with the virus. It may also increase the risk of miscarriage. WHAT ARE THE SYMPTOMS OF ZIKA VIRUS DISEASE? In many cases, people who have been infected with Zika virus do not develop any symptoms. If symptoms appear, they usually start about a week after the person is infected. Symptoms are usually mild. They may include:  Fever.  Rash.  Red eyes.  Joint pain. HOW DOES ZIKA VIRUS DISEASE SPREAD? The main way that Zika virus spreads is through the bite of a certain type of mosquito. Unlike most types of mosquitos, which bite only at night, the type of mosquito that carries Zika virus bites both at night and during the day. Zika virus can also spread through sexual contact, through a blood transfusion, and from a mother to her baby before or during birth. Once you have had Zika virus disease, it is unlikely that you will get it again. CAN I PASS ZIKA TO MY BABY DURING PREGNANCY? Yes, Zika can pass from a mother to her baby before or during birth. WHAT PROBLEMS CAN ZIKA CAUSE FOR MY BABY? A woman who is infected with Zika virus while pregnant is at risk of having her baby born with a condition in which the brain or head is smaller than expected (microcephaly). Babies who have microcephaly can have developmental delays, seizures, hearing problems, and vision problems. Having Zika virus disease during pregnancy can also increase the risk of miscarriage. HOW CAN ZIKA VIRUS DISEASE BE  PREVENTED? There is no vaccine to prevent Zika. The best way to prevent the disease is to avoid infected mosquitoes and avoid exposure to body fluids that can spread the virus. Avoid any possible exposure to Zika by taking the following precautions. For women and their sex partners:  Avoid traveling to high-risk areas. The locations where Zika is being reported change often. To identify high-risk areas, check the CDC travel website: www.cdc.gov/zika/geo/index.html  If you or your sex partner must travel to a high-risk area, talk with a health care provider before and after traveling.  Take all precautions to avoid mosquito bites if you live in, or travel to, any of the high-risk areas. Insect repellents are safe to use during pregnancy.  Ask your health care provider when it is safe to have sexual contact. For women:  If you are pregnant or trying to become pregnant, avoid sexual contact with persons who may have been exposed to Zika virus, persons who have possible symptoms of Zika, or persons whose history you are unsure about. If you choose to have sexual contact with someone who may have been exposed to Zika virus, use condoms correctly during the entire duration of sexual activity, every time. Do not share sexual devices, as you may be exposed to body fluids.  Ask your health care provider about when it is safe to attempt pregnancy after a possible exposure to Zika virus. WHAT STEPS SHOULD I TAKE TO AVOID MOSQUITO BITES? Take these steps to avoid mosquito bites when you are   in a high-risk area:  Wear loose clothing that covers your arms and legs.  Limit your outdoor activities.  Do not open windows unless they have window screens.  Sleep under mosquito nets.  Use insect repellent. The best insect repellents have:  DEET, picaridin, oil of lemon eucalyptus (OLE), or [id number] in them.  Higher amounts of an active ingredient in them.  Remember that insect repellents are safe to use  during pregnancy.  Do not use OLE on children who are younger than 3 years of age. Do not use insect repellent on babies who are younger than 2 months of age.  Cover your child's stroller with mosquito netting. Make sure the netting fits snugly and that any loose netting does not cover your child's mouth or nose. Do not use a blanket as a mosquito-protection cover.  Do not apply insect repellent underneath clothing.  If you are using sunscreen, apply the sunscreen before applying the insect repellent.  Treat clothing with permethrin. Do not apply permethrin directly to your skin. Follow label directions for safe use.  Get rid of standing water, where mosquitoes may reproduce. Standing water is often found in items such as buckets, bowls, animal food dishes, and flowerpots. When you return from traveling to any high-risk area, continue taking actions to protect yourself against mosquito bites for 3 weeks, even if you show no signs of illness. This will prevent spreading Zika virus to uninfected mosquitoes. WHAT SHOULD I KNOW ABOUT THE SEXUAL TRANSMISSION OF ZIKA? People can spread Zika to their sexual partners during vaginal, anal, or oral sex, or by sharing sexual devices. Many people with Zika do not develop symptoms, so a person could spread the disease without knowing that they are infected. The greatest risk is to women who are pregnant or who may become pregnant. Zika virus can live longer in semen than it can live in blood. Couples can prevent sexual transmission of the virus by:  Using condoms correctly during the entire duration of sexual activity, every time. This includes vaginal, anal, and oral sex.  Not sharing sexual devices. Sharing increases your risk of being exposed to body fluid from another person.  Avoiding all sexual activity until your health care provider says it is safe. SHOULD I BE TESTED FOR ZIKA VIRUS? A sample of your blood can be tested for Zika virus. A pregnant  woman should be tested if she may have been exposed to the virus or if she has symptoms of Zika. She may also have additional tests done during her pregnancy, such ultrasound testing. Talk with your health care provider about which tests are recommended.   This information is not intended to replace advice given to you by your health care provider. Make sure you discuss any questions you have with your health care provider.   Document Released: 04/02/2015 Document Reviewed: 03/26/2015 Elsevier Interactive Patient Education 2016 [employer] Minor Illnesses and Medications in Pregnancy  Cold/Flu:  Sudafed for congestion- Robitussin (plain) for cough- Tylenol for discomfort.  Please follow the directions on the label.  Try not to take any more than needed.  OTC Saline nasal spray and air humidifier or cool-mist  Vaporizer to sooth nasal irritation and to loosen congestion.  It is also important to increase intake of non carbonated fluids, especially if you have a fever.  Constipation:  Colace-2 capsules at bedtime; Metamucil- follow directions on label; Senokot- 1 tablet at bedtime.  Any one of these medications can be used.  It is also   very important to increase fluids and fruits along with regular exercise.  If problem persists please call the office.  Diarrhea:  Kaopectate as directed on the label.  Eat a bland diet and increase fluids.  Avoid highly seasoned foods.  Headache:  Tylenol 1 or 2 tablets every 3-4 hours as needed  Indigestion:  Maalox, Mylanta, Tums or Rolaids- as directed on label.  Also try to eat small meals and avoid fatty, greasy or spicy foods.  Nausea with or without Vomiting:  Nausea in pregnancy is caused by increased levels of hormones in the body which influence the digestive system and cause irritation when stomach acids accumulate.  Symptoms usually subside after 1st trimester of pregnancy.  Try the following:  Keep saltines, graham crackers or dry toast by your bed  to eat upon awakening.  Don't let your stomach get empty.  Try to eat 5-6 small meals per day instead of 3 large ones.  Avoid greasy fatty or highly seasoned foods.   Take OTC Unisom 1 tablet at bed time along with OTC Vitamin B6 25-50 mg 3 times per day.    If nausea continues with vomiting and you are unable to keep down food and fluids you may need a prescription medication.  Please notify your provider.   Sore throat:  Chloraseptic spray, throat lozenges and or plain Tylenol.  Vaginal Yeast Infection:  OTC Monistat for 7 days as directed on label.  If symptoms do not resolve within a week notify provider.  If any of the above problems do not subside with recommended treatment please call the office for further assistance.   Do not take Aspirin, Advil, Motrin or Ibuprofen.  * * OTC= Over the counter Hyperemesis Gravidarum Hyperemesis gravidarum is a severe form of nausea and vomiting that happens during pregnancy. Hyperemesis is worse than morning sickness. It may cause you to have nausea or vomiting all day for many days. It may keep you from eating and drinking enough food and liquids. Hyperemesis usually occurs during the first half (the first 20 weeks) of pregnancy. It often goes away once a woman is in her second half of pregnancy. However, sometimes hyperemesis continues through an entire pregnancy.  CAUSES  The cause of this condition is not completely known but is thought to be related to changes in the body's hormones when pregnant. It could be from the high level of the pregnancy hormone or an increase in estrogen in the body.  SIGNS AND SYMPTOMS   Severe nausea and vomiting.  Nausea that does not go away.  Vomiting that does not allow you to keep any food down.  Weight loss and body fluid loss (dehydration).  Having no desire to eat or not liking food you have previously enjoyed. DIAGNOSIS  Your health care provider will do a physical exam and ask you about your symptoms.  He or she may also order blood tests and urine tests to make sure something else is not causing the problem.  TREATMENT  You may only need medicine to control the problem. If medicines do not control the nausea and vomiting, you will be treated in the hospital to prevent dehydration, increased acid in the blood (acidosis), weight loss, and changes in the electrolytes in your body that may harm the unborn baby (fetus). You may need IV fluids.  HOME CARE INSTRUCTIONS   Only take over-the-counter or prescription medicines as directed by your health care provider.  Try eating a couple of dry crackers or   toast in the morning before getting out of bed.  Avoid foods and smells that upset your stomach.  Avoid fatty and spicy foods.  Eat 5-6 small meals a day.  Do not drink when eating meals. Drink between meals.  For snacks, eat high-protein foods, such as cheese.  Eat or suck on things that have ginger in them. Ginger helps nausea.  Avoid food preparation. The smell of food can spoil your appetite.  Avoid iron pills and iron in your multivitamins until after 3-4 months of being pregnant. However, consult with your health care provider before stopping any prescribed iron pills. SEEK MEDICAL CARE IF:   Your abdominal pain increases.  You have a severe headache.  You have vision problems.  You are losing weight. SEEK IMMEDIATE MEDICAL CARE IF:   You are unable to keep fluids down.  You vomit blood.  You have constant nausea and vomiting.  You have excessive weakness.  You have extreme thirst.  You have dizziness or fainting.  You have a fever or persistent symptoms for more than 2-3 days.  You have a fever and your symptoms suddenly get worse. MAKE SURE YOU:   Understand these instructions.  Will watch your condition.  Will get help right away if you are not doing well or get worse.   This information is not intended to replace advice given to you by your health care  provider. Make sure you discuss any questions you have with your health care provider.   Document Released: 07/12/2005 Document Revised: 05/02/2013 Document Reviewed: 02/21/2013 Elsevier Interactive Patient Education 2016 Elsevier Inc. Commonly Asked Questions During Pregnancy  Cats: A parasite can be excreted in cat feces.  To avoid exposure you need to have another person empty the little box.  If you must empty the litter box you will need to wear gloves.  Wash your hands after handling your cat.  This parasite can also be found in raw or undercooked meat so this should also be avoided.  Colds, Sore Throats, Flu: Please check your medication sheet to see what you can take for symptoms.  If your symptoms are unrelieved by these medications please call the office.  Dental Work: Most any dental work your dentist recommends is permitted.  X-rays should only be taken during the first trimester if absolutely necessary.  Your abdomen should be shielded with a lead apron during all x-rays.  Please notify your provider prior to receiving any x-rays.  Novocaine is fine; gas is not recommended.  If your dentist requires a note from us prior to dental work please call the office and we will provide one for you.  Exercise: Exercise is an important part of staying healthy during your pregnancy.  You may continue most exercises you were accustomed to prior to pregnancy.  Later in your pregnancy you will most likely notice you have difficulty with activities requiring balance like riding a bicycle.  It is important that you listen to your body and avoid activities that put you at a higher risk of falling.  Adequate rest and staying well hydrated are a must!  If you have questions about the safety of specific activities ask your provider.    Exposure to Children with illness: Try to avoid obvious exposure; report any symptoms to us when noted,  If you have chicken pos, red measles or mumps, you should be immune to  these diseases.   Please do not take any vaccines while pregnant unless you have checked with   your OB provider.  Fetal Movement: After 28 weeks we recommend you do "kick counts" twice daily.  Lie or sit down in a calm quiet environment and count your baby movements "kicks".  You should feel your baby at least 10 times per hour.  If you have not felt 10 kicks within the first hour get up, walk around and have something sweet to eat or drink then repeat for an additional hour.  If count remains less than 10 per hour notify your provider.  Fumigating: Follow your pest control agent's advice as to how long to stay out of your home.  Ventilate the area well before re-entering.  Hemorrhoids:   Most over-the-counter preparations can be used during pregnancy.  Check your medication to see what is safe to use.  It is important to use a stool softener or fiber in your diet and to drink lots of liquids.  If hemorrhoids seem to be getting worse please call the office.   Hot Tubs:  Hot tubs Jacuzzis and saunas are not recommended while pregnant.  These increase your internal body temperature and should be avoided.  Intercourse:  Sexual intercourse is safe during pregnancy as long as you are comfortable, unless otherwise advised by your provider.  Spotting may occur after intercourse; report any bright red bleeding that is heavier than spotting.  Labor:  If you know that you are in labor, please go to the hospital.  If you are unsure, please call the office and let us help you decide what to do.  Lifting, straining, etc:  If your job requires heavy lifting or straining please check with your provider for any limitations.  Generally, you should not lift items heavier than that you can lift simply with your hands and arms (no back muscles)  Painting:  Paint fumes do not harm your pregnancy, but may make you ill and should be avoided if possible.  Latex or water based paints have less odor than oils.  Use adequate  ventilation while painting.  Permanents & Hair Color:  Chemicals in hair dyes are not recommended as they cause increase hair dryness which can increase hair loss during pregnancy.  " Highlighting" and permanents are allowed.  Dye may be absorbed differently and permanents may not hold as well during pregnancy.  Sunbathing:  Use a sunscreen, as skin burns easily during pregnancy.  Drink plenty of fluids; avoid over heating.  Tanning Beds:  Because their possible side effects are still unknown, tanning beds are not recommended.  Ultrasound Scans:  Routine ultrasounds are performed at approximately 20 weeks.  You will be able to see your baby's general anatomy an if you would like to know the gender this can usually be determined as well.  If it is questionable when you conceived you may also receive an ultrasound early in your pregnancy for dating purposes.  Otherwise ultrasound exams are not routinely performed unless there is a medical necessity.  Although you can request a scan we ask that you pay for it when conducted because insurance does not cover " patient request" scans.  Work: If your pregnancy proceeds without complications you may work until your due date, unless your physician or employer advises otherwise.  Round Ligament Pain/Pelvic Discomfort:  Sharp, shooting pains not associated with bleeding are fairly common, usually occurring in the second trimester of pregnancy.  They tend to be worse when standing up or when you remain standing for long periods of time.  These are the result   of pressure of certain pelvic ligaments called "round ligaments".  Rest, Tylenol and heat seem to be the most effective relief.  As the womb and fetus grow, they rise out of the pelvis and the discomfort improves.  Please notify the office if your pain seems different than that described.  It may represent a more serious condition.   

## 2015-10-28 NOTE — Progress Notes (Signed)
Destyne Margaretann Loveless Lundin presents for NOB nurse interview visit. G-3.  P-1011. Pregnancy confirmed on 10/08/2015.  US done on 10/15/2015 for irreg menses with EDD: 06/01/2016. This is one day difference from her lmp. Pregnancy education material explained and given. No cats in the home. NOB labs ordered. HIV labs and Drug screen were explained optional and she could opt out of tests but did not decline. Drug screen ordered. PNV encouraged. NT to discuss with provider. Pt. To follow up with provider in 3 weeks for NOB physical.  All questions answered.    ZIKA EXPOSURE SCREEN:  The patient has not traveled to a [place] endemic area within the past 6 months, nor has she had unprotected sex with a partner who has travelled to a [place] endemic region within the past 6 months. The patient has been advised to notify us if these factors change any time during this current pregnancy, so adequate testing and monitoring can be initiated.

## 2015-10-29 ENCOUNTER — Other Ambulatory Visit: Payer: Self-pay | Admitting: Obstetrics and Gynecology

## 2015-10-29 DIAGNOSIS — Z2839 Other underimmunization status: Secondary | ICD-10-CM

## 2015-10-29 DIAGNOSIS — O9989 Other specified diseases and conditions complicating pregnancy, childbirth and the puerperium: Principal | ICD-10-CM

## 2015-10-29 DIAGNOSIS — Z283 Underimmunization status: Secondary | ICD-10-CM

## 2015-10-29 LAB — CBC WITH DIFFERENTIAL/PLATELET
BASOS ABS: 0 10*3/uL (ref 0.0–0.2)
BASOS: 0 %
EOS (ABSOLUTE): 0 10*3/uL (ref 0.0–0.4)
Eos: 0 %
Hematocrit: 39.2 % (ref 34.0–46.6)
Hemoglobin: 12.9 g/dL (ref 11.1–15.9)
IMMATURE GRANS (ABS): 0 10*3/uL (ref 0.0–0.1)
Immature Granulocytes: 0 %
LYMPHS ABS: 2 10*3/uL (ref 0.7–3.1)
Lymphs: 30 %
MCH: 28.9 pg (ref 26.6–33.0)
MCHC: 32.9 g/dL (ref 31.5–35.7)
MCV: 88 fL (ref 79–97)
Monocytes Absolute: 0.4 10*3/uL (ref 0.1–0.9)
Monocytes: 6 %
NEUTROS ABS: 4.3 10*3/uL (ref 1.4–7.0)
Neutrophils: 64 %
PLATELETS: 288 10*3/uL (ref 150–379)
RBC: 4.47 x10E6/uL (ref 3.77–5.28)
RDW: 13.1 % (ref 12.3–15.4)
WBC: 6.7 10*3/uL (ref 3.4–10.8)

## 2015-10-29 LAB — ABO

## 2015-10-29 LAB — VARICELLA ZOSTER ANTIBODY, IGM: Varicella IgM: <0.91 index (ref 0.00–0.90)

## 2015-10-29 LAB — ANTIBODY SCREEN: ANTIBODY SCREEN: NEGATIVE

## 2015-10-29 LAB — GC/CHLAMYDIA PROBE AMP
CHLAMYDIA, DNA PROBE: NEGATIVE
Neisseria gonorrhoeae by PCR: NEGATIVE

## 2015-10-29 LAB — RUBELLA ANTIBODY, IGM

## 2015-10-29 LAB — HEPATITIS B SURFACE ANTIGEN: HEP B S AG: NEGATIVE

## 2015-10-29 LAB — HIV ANTIBODY (ROUTINE TESTING W REFLEX): HIV SCREEN 4TH GENERATION: NONREACTIVE

## 2015-10-29 LAB — RPR: RPR: NONREACTIVE

## 2015-10-29 LAB — RH TYPE: Rh Factor: POSITIVE

## 2015-10-30 ENCOUNTER — Other Ambulatory Visit: Payer: Self-pay | Admitting: Obstetrics and Gynecology

## 2015-10-30 DIAGNOSIS — Z283 Underimmunization status: Principal | ICD-10-CM

## 2015-10-30 DIAGNOSIS — O09899 Supervision of other high risk pregnancies, unspecified trimester: Secondary | ICD-10-CM | POA: Insufficient documentation

## 2015-10-30 DIAGNOSIS — Z2839 Other underimmunization status: Secondary | ICD-10-CM

## 2015-10-30 LAB — URINALYSIS, ROUTINE W REFLEX MICROSCOPIC
Bilirubin, UA: NEGATIVE
GLUCOSE, UA: NEGATIVE
Ketones, UA: NEGATIVE
Leukocytes, UA: NEGATIVE
Nitrite, UA: NEGATIVE
RBC, UA: NEGATIVE
Specific Gravity, UA: 1.025 (ref 1.005–1.030)
Urobilinogen, Ur: 0.2 mg/dL (ref 0.2–1.0)
pH, UA: 7 (ref 5.0–7.5)

## 2015-10-30 LAB — PAIN MGT SCRN (14 DRUGS), UR
AMPHETAMINE SCRN UR: NEGATIVE ng/mL
BUPRENORPHINE, URINE: NEGATIVE ng/mL
Barbiturate Screen, Ur: NEGATIVE ng/mL
Benzodiazepine Screen, Urine: NEGATIVE ng/mL
Cannabinoids Ur Ql Scn: NEGATIVE ng/mL
Cocaine(Metab.)Screen, Urine: NEGATIVE ng/mL
Creatinine(Crt), U: 152.7 mg/dL (ref 20.0–300.0)
Fentanyl, Urine: NEGATIVE pg/mL
METHADONE SCREEN, URINE: NEGATIVE ng/mL
Meperidine Screen, Urine: NEGATIVE ng/mL
Opiate Scrn, Ur: NEGATIVE ng/mL
Oxycodone+Oxymorphone Ur Ql Scn: NEGATIVE ng/mL
PCP SCRN UR: NEGATIVE ng/mL
PH UR, DRUG SCRN: 7.2 (ref 4.5–8.9)
Propoxyphene, Screen: NEGATIVE ng/mL
TRAMADOL UR QL SCN: NEGATIVE ng/mL

## 2015-10-30 LAB — MICROSCOPIC EXAMINATION: CASTS: NONE SEEN /LPF

## 2015-10-30 LAB — NICOTINE SCREEN, URINE: Cotinine Ql Scrn, Ur: NEGATIVE ng/mL

## 2015-10-30 LAB — CULTURE, OB URINE

## 2015-10-30 LAB — URINE CULTURE, OB REFLEX: Organism ID, Bacteria: NO GROWTH

## 2015-11-12 ENCOUNTER — Encounter: Payer: BLUE CROSS/BLUE SHIELD | Admitting: Obstetrics and Gynecology

## 2015-11-19 ENCOUNTER — Ambulatory Visit (INDEPENDENT_AMBULATORY_CARE_PROVIDER_SITE_OTHER): Payer: BLUE CROSS/BLUE SHIELD | Admitting: Obstetrics and Gynecology

## 2015-11-19 ENCOUNTER — Encounter: Payer: Self-pay | Admitting: Obstetrics and Gynecology

## 2015-11-19 VITALS — BP 111/71 | HR 85 | Wt 129.8 lb

## 2015-11-19 DIAGNOSIS — O9989 Other specified diseases and conditions complicating pregnancy, childbirth and the puerperium: Secondary | ICD-10-CM

## 2015-11-19 DIAGNOSIS — Z331 Pregnant state, incidental: Secondary | ICD-10-CM

## 2015-11-19 DIAGNOSIS — O09899 Supervision of other high risk pregnancies, unspecified trimester: Secondary | ICD-10-CM

## 2015-11-19 DIAGNOSIS — Z283 Underimmunization status: Secondary | ICD-10-CM

## 2015-11-19 DIAGNOSIS — Z789 Other specified health status: Secondary | ICD-10-CM

## 2015-11-19 LAB — POCT URINALYSIS DIPSTICK
Bilirubin, UA: NEGATIVE
Blood, UA: NEGATIVE
Glucose, UA: NEGATIVE
Ketones, UA: NEGATIVE
LEUKOCYTES UA: NEGATIVE
Nitrite, UA: NEGATIVE
PH UA: 6.5
PROTEIN UA: NEGATIVE
SPEC GRAV UA: 1.015
UROBILINOGEN UA: 0.2

## 2015-11-19 NOTE — Progress Notes (Signed)
NOB- pt is doing well, has nausea @ night

## 2015-11-19 NOTE — Patient Instructions (Signed)

## 2015-11-19 NOTE — Progress Notes (Signed)
NEW OB HISTORY AND PHYSICAL  SUBJECTIVE:       Jessica Frederick is a 30 y.o. [id number] female, Patient's last menstrual period was 08/26/2015 (approximate)., Estimated Date of Delivery: 06/01/16, [id number], presents today for establishment of Prenatal Care. She has no unusual complaints and complains of NAUSEA IN EVENINGS, BUT OVERALL FEELING BETTER      Gynecologic History Patient's last menstrual period was 08/26/2015 (approximate). Normal Contraception: none Last Pap: 2016. Results were: normal  Obstetric History OB History  Gravida Para Term Preterm AB SAB TAB Ectopic Multiple Living  3 1 1  1 1    1     # Outcome Date GA Lbr Len/2nd Weight Sex Delivery Anes PTL Lv  3 Current           2 Term 07/21/11 [id number] 05:55 / 03:39 7 lb 6.5 oz (3.36 kg) M CS-LTranv Spinal  Y  1 SAB 2012              Past Medical History  Diagnosis Date  . Right ovarian cyst   . Difficult intubation     Past Surgical History  Procedure Laterality Date  . Cesarean section  07/21/2011    Procedure: CESAREAN SECTION;  Surgeon: Zenaida Nieceodd D Meisinger, MD;  Location: [facility];  Service: Gynecology;  Laterality: N/A;  Primary cesarean section    Current Outpatient Prescriptions on File Prior to Visit  Medication Sig Dispense Refill  . Doxylamine-Pyridoxine 10-10 MG TBEC Day 1 & 2: Take 2 tablets at bedtime on an empty stomach. Day 3: If symptoms persist take 1 tablet in the morning and 2 tables at bedtime. Day 4: If symptoms persist take 1 tablet in the morning, 1 tablet mid-afternoon and 2 tablets at bedtime. Max Dose: 4 tablets a day. Pregnancy Category A 100 tablet 1  . Prenatal Vit-Fe Fumarate-FA (PRENATAL MULTIVITAMIN) TABS Take 1 tablet by mouth daily.       No current facility-administered medications on file prior to visit.    No Known Allergies  Social History   Social History  . Marital Status: Married    Spouse Name: N/A  . Number of Children: N/A  . Years of Education: N/A   Occupational  History  . Not on file.   Social History Main Topics  . Smoking status: Never Smoker   . Smokeless tobacco: Never Used  . Alcohol Use: No  . Drug Use: No  . Sexual Activity: Yes    Birth Control/ Protection: None     Comment: mirena   Other Topics Concern  . Not on file   Social History Narrative    Family History  Problem Relation Age of Onset  . Diabetes Mother   . Breast cancer Paternal Grandmother   . Colon cancer Neg Hx   . Ovarian cancer Neg Hx   . Heart disease Neg Hx     The following portions of the patient's history were reviewed and updated as appropriate: allergies, current medications, past OB history, past medical history, past surgical history, past family history, past social history, and problem list.    OBJECTIVE: Initial Physical Exam (New OB)  GENERAL APPEARANCE: alert, well appearing, in no apparent distress, oriented to person, place and time HEAD: normocephalic, atraumatic MOUTH: mucous membranes moist, pharynx normal without lesions and dental hygiene good THYROID: no thyromegaly or masses present BREASTS: not examined LUNGS: clear to auscultation, no wheezes, rales or rhonchi, symmetric air entry HEART: regular rate and rhythm, no murmurs ABDOMEN: soft, nontender,  nondistended, no abnormal masses, no epigastric pain, fundus not palpable and FHT present EXTREMITIES: no redness or tenderness in the calves or thighs SKIN: normal coloration and turgor, no rashes LYMPH NODES: no adenopathy palpable NEUROLOGIC: alert, oriented, normal speech, no focal findings or movement disorder noted  PELVIC EXAM not indicated  ASSESSMENT: Normal pregnancy Previous c/s for FTP- desires repeat  PLAN: Prenatal care Desires genetic testing- drawn labs today See orders

## 2015-12-03 ENCOUNTER — Encounter: Payer: Self-pay | Admitting: Obstetrics and Gynecology

## 2015-12-19 ENCOUNTER — Encounter: Payer: BLUE CROSS/BLUE SHIELD | Admitting: Obstetrics and Gynecology

## 2015-12-19 ENCOUNTER — Encounter: Payer: Self-pay | Admitting: Obstetrics and Gynecology

## 2015-12-19 ENCOUNTER — Ambulatory Visit (INDEPENDENT_AMBULATORY_CARE_PROVIDER_SITE_OTHER): Payer: BLUE CROSS/BLUE SHIELD | Admitting: Obstetrics and Gynecology

## 2015-12-19 VITALS — BP 105/78 | HR 87 | Wt 132.8 lb

## 2015-12-19 DIAGNOSIS — Z3492 Encounter for supervision of normal pregnancy, unspecified, second trimester: Secondary | ICD-10-CM

## 2015-12-19 LAB — POCT URINALYSIS DIPSTICK
Bilirubin, UA: NEGATIVE
Blood, UA: NEGATIVE
GLUCOSE UA: NEGATIVE
Ketones, UA: NEGATIVE
LEUKOCYTES UA: NEGATIVE
NITRITE UA: NEGATIVE
Spec Grav, UA: 1.01
UROBILINOGEN UA: 0.2
pH, UA: 7.5

## 2015-12-19 NOTE — Progress Notes (Signed)
ROB- pt is doing well, denies any complaints 

## 2015-12-19 NOTE — Progress Notes (Signed)
ROB- doing well, anatomy scan next visit 

## 2016-01-13 ENCOUNTER — Ambulatory Visit (INDEPENDENT_AMBULATORY_CARE_PROVIDER_SITE_OTHER): Payer: BLUE CROSS/BLUE SHIELD

## 2016-01-13 DIAGNOSIS — Z3492 Encounter for supervision of normal pregnancy, unspecified, second trimester: Secondary | ICD-10-CM | POA: Diagnosis not present

## 2016-01-15 ENCOUNTER — Encounter: Payer: BLUE CROSS/BLUE SHIELD | Admitting: Obstetrics and Gynecology

## 2016-01-22 ENCOUNTER — Encounter: Payer: Self-pay | Admitting: Obstetrics and Gynecology

## 2016-01-22 ENCOUNTER — Ambulatory Visit (INDEPENDENT_AMBULATORY_CARE_PROVIDER_SITE_OTHER): Payer: BLUE CROSS/BLUE SHIELD | Admitting: Obstetrics and Gynecology

## 2016-01-22 VITALS — BP 105/68 | HR 90 | Wt 141.0 lb

## 2016-01-22 DIAGNOSIS — Z3492 Encounter for supervision of normal pregnancy, unspecified, second trimester: Secondary | ICD-10-CM

## 2016-01-22 LAB — POCT URINALYSIS DIPSTICK
BILIRUBIN UA: NEGATIVE
Blood, UA: NEGATIVE
Glucose, UA: NEGATIVE
Ketones, UA: NEGATIVE
LEUKOCYTES UA: NEGATIVE
NITRITE UA: NEGATIVE
Protein, UA: NEGATIVE
Spec Grav, UA: 1.01
Urobilinogen, UA: 0.2
pH, UA: 7

## 2016-01-22 NOTE — Progress Notes (Signed)
ROB-pt denies any complaints 

## 2016-01-22 NOTE — Progress Notes (Signed)
ROB-doing well, discussed LLP and normal anatomy scan

## 2016-03-02 ENCOUNTER — Ambulatory Visit (INDEPENDENT_AMBULATORY_CARE_PROVIDER_SITE_OTHER): Payer: BLUE CROSS/BLUE SHIELD | Admitting: Obstetrics and Gynecology

## 2016-03-02 VITALS — BP 94/64 | HR 90 | Wt 148.9 lb

## 2016-03-02 DIAGNOSIS — Z3493 Encounter for supervision of normal pregnancy, unspecified, third trimester: Secondary | ICD-10-CM

## 2016-03-02 LAB — POCT URINALYSIS DIPSTICK
Bilirubin, UA: NEGATIVE
Blood, UA: NEGATIVE
Glucose, UA: NEGATIVE
Ketones, UA: NEGATIVE
LEUKOCYTES UA: NEGATIVE
NITRITE UA: NEGATIVE
PH UA: 7.5
Spec Grav, UA: 1.01
Urobilinogen, UA: 0.2

## 2016-03-02 NOTE — Progress Notes (Signed)
ROB- doing well, blood consent signed, discussed cord blood donation vs banking. glucola and repeat u/s scheduled for next week.

## 2016-03-02 NOTE — Progress Notes (Signed)
ROB- pt is doing well, blood consent signed 

## 2016-03-05 ENCOUNTER — Other Ambulatory Visit: Payer: Self-pay | Admitting: Obstetrics and Gynecology

## 2016-03-05 DIAGNOSIS — O4443 Low lying placenta NOS or without hemorrhage, third trimester: Secondary | ICD-10-CM

## 2016-03-09 ENCOUNTER — Ambulatory Visit (INDEPENDENT_AMBULATORY_CARE_PROVIDER_SITE_OTHER): Payer: BLUE CROSS/BLUE SHIELD

## 2016-03-09 ENCOUNTER — Other Ambulatory Visit: Payer: BLUE CROSS/BLUE SHIELD

## 2016-03-09 ENCOUNTER — Other Ambulatory Visit: Payer: Self-pay | Admitting: *Deleted

## 2016-03-09 DIAGNOSIS — O4443 Low lying placenta NOS or without hemorrhage, third trimester: Secondary | ICD-10-CM

## 2016-03-09 DIAGNOSIS — Z3493 Encounter for supervision of normal pregnancy, unspecified, third trimester: Secondary | ICD-10-CM

## 2016-03-09 DIAGNOSIS — Z131 Encounter for screening for diabetes mellitus: Secondary | ICD-10-CM

## 2016-03-10 LAB — GLUCOSE, 1 HOUR GESTATIONAL: Gestational Diabetes Screen: 81 mg/dL (ref 65–139)

## 2016-03-10 LAB — HEMOGLOBIN AND HEMATOCRIT, BLOOD
HEMATOCRIT: 33.7 % — AB (ref 34.0–46.6)
Hemoglobin: 11 g/dL — ABNORMAL LOW (ref 11.1–15.9)

## 2016-03-30 ENCOUNTER — Ambulatory Visit (INDEPENDENT_AMBULATORY_CARE_PROVIDER_SITE_OTHER): Payer: BLUE CROSS/BLUE SHIELD | Admitting: Obstetrics and Gynecology

## 2016-03-30 VITALS — BP 111/73 | HR 94 | Wt 155.7 lb

## 2016-03-30 DIAGNOSIS — Z3493 Encounter for supervision of normal pregnancy, unspecified, third trimester: Secondary | ICD-10-CM

## 2016-03-30 LAB — POCT URINALYSIS DIPSTICK
Bilirubin, UA: NEGATIVE
Blood, UA: NEGATIVE
Glucose, UA: NEGATIVE
KETONES UA: NEGATIVE
LEUKOCYTES UA: NEGATIVE
NITRITE UA: NEGATIVE
PH UA: 7
Spec Grav, UA: 1.01
Urobilinogen, UA: 0.2

## 2016-03-30 NOTE — Progress Notes (Signed)
ROB-doing well, no concerns. 

## 2016-03-30 NOTE — Progress Notes (Signed)
ROB- pt is doing well, denies any complaints 

## 2016-04-13 ENCOUNTER — Ambulatory Visit (INDEPENDENT_AMBULATORY_CARE_PROVIDER_SITE_OTHER): Payer: BLUE CROSS/BLUE SHIELD | Admitting: Obstetrics and Gynecology

## 2016-04-13 VITALS — BP 102/71 | HR 110 | Wt 158.7 lb

## 2016-04-13 DIAGNOSIS — Z3493 Encounter for supervision of normal pregnancy, unspecified, third trimester: Secondary | ICD-10-CM

## 2016-04-13 DIAGNOSIS — Z98891 History of uterine scar from previous surgery: Secondary | ICD-10-CM

## 2016-04-13 DIAGNOSIS — Z3483 Encounter for supervision of other normal pregnancy, third trimester: Secondary | ICD-10-CM

## 2016-04-13 LAB — POCT URINALYSIS DIPSTICK
BILIRUBIN UA: NEGATIVE
Blood, UA: NEGATIVE
GLUCOSE UA: NEGATIVE
KETONES UA: NEGATIVE
Nitrite, UA: NEGATIVE
Protein, UA: NEGATIVE
SPEC GRAV UA: 1.015
Urobilinogen, UA: NEGATIVE
pH, UA: 7.5

## 2016-04-13 NOTE — Progress Notes (Signed)
ROB: Referred from midwife for discussion of repeat C-section.  Patient with h/o prior C-section for arrest in second stage (failure to descend after full dilation with 4 hrs pushing).  Patient declines TOLAC.  The risks of cesarean section discussed with the patient included but were not limited to: bleeding which may require transfusion or reoperation; infection which may require antibiotics; injury to bowel, bladder, ureters or other surrounding organs; injury to the fetus; need for additional procedures including hysterectomy in the event of a life-threatening hemorrhage; placental abnormalities wth subsequent pregnancies, incisional problems, thromboembolic phenomenon and other postoperative/anesthesia complications. Will schedule C-section for 05/28/16.  Pre-op done today.  RTC in 2 weeks. Due for flu vaccine next visit.

## 2016-04-13 NOTE — Patient Instructions (Signed)
Cesarean Delivery Cesarean delivery is the birth of a baby through a cut (incision) in the abdomen and womb (uterus).  LET YOUR HEALTH CARE PROVIDER KNOW ABOUT:  All medicines you are taking, including vitamins, herbs, eye drops, creams, and over-the-counter medicines.  Previous problems you or members of your family have had with the use of anesthetics.  Any bleeding or blood clotting disorders you have.  Family history of blood clots or bleeding disorders.  Any history of deep vein thrombosis (DVT) or pulmonary embolism (PE).  Previous surgeries you have had.  Medical conditions you have.  Any allergies you have.  Complicationsinvolving the pregnancy. RISKS AND COMPLICATIONS  Generally, this is a safe procedure. However, as with any procedure, complications can occur. Possible complications include:  Bleeding.  Infection.  Blood clots.  Injury to surrounding organs.  Problems with anesthesia.  Injury to the baby. BEFORE THE PROCEDURE   You may be given an antacid medicine to drink. This will prevent acid contents in your stomach from going into your lungs if you vomit during the surgery.  You may be given an antibiotic medicine to prevent infection. PROCEDURE   To prevent infection of your incision:  Hair may be removed from your pubic area if it is near your incision.  The skin of your pubic area and lower abdomen will be cleaned with a germ-killing solution (antiseptic).  A tube (Foley catheter) will be placed in your bladder to drain your urine from your bladder into a bag. This keeps your bladder empty during surgery.  An IV tube will be placed in your vein.  You may be given medicine to numb the lower half of your body (regional anesthetic). If you were in labor, you may have already had an epidural in place which can be used in both labor and cesarean delivery. You may possibly be given medicine to make you sleep (general anesthetic) though this is not as  common.  Your heart rate and your baby's heart rate will be monitored.  An incision will be made in your abdomen that extends to your uterus. There are 2 basic kinds of incisions:  The horizontal (transverse) incision. Horizontal incisions are from side to side and are used for most routine cesarean deliveries.  The vertical incision. The vertical incision is from the top of the abdomen to the bottom and is less commonly used. It is often done for women who have a serious complication (extreme prematurity) or under emergency situations.  The horizontal and vertical incisions may both be used at the same time. However, this is very uncommon.  An incision is then made in your uterus to deliver the baby.  Your baby will be delivered.  Your health care provider may place the baby on your chest. It is important to keep the baby warm. Your health care provider will dry off the baby, place the baby directly on your bare skin, and cover the baby with warm, dry blankets.  Both incisions will be closed with absorbable stitches. AFTER THE PROCEDURE   If you were awake during the surgery, you will see your baby right away. If you were asleep, you will see your baby as soon as you are awake.  You may breastfeed your baby after surgery.  You may be able to get up and walk the same day as the surgery. If you need to stay in bed for a period of time, you will receive help to turn, cough, and take deep breaths after   surgery. This helps prevent lung problems such as pneumonia.  Do not get out of bed alone the first time after surgery. You will need help getting out of bed until you are able to do this by yourself.  You may be able to shower the day after your cesarean delivery. After the bandage (dressing) is taken off the incision site, a nurse will assist you to shower if you would like help.  You may be directed to take actions to help prevent blood clots in your legs. These may  include:  Walking shortly after surgery, with someone assisting you. Moving around after surgery helps to improve blood flow.  Wearing compression stockings or using different types of devices.  Taking medicines to thin your blood (anticoagulants) if you are at high risk for DVT or PE.  Save any blood clots that you pass from your vagina. If you pass a clot while on the toilet, do not flush it. Call for the nurse. Tell the nurse if you think you are bleeding too much or passing too many clots.  You will be given medicine for pain and nausea as needed. Let your health care providers know if you are hurting. You may also be given an antibiotic to prevent an infection.  Your IV tube will be taken out when you are drinking a reasonable amount of fluids. The Foley catheter is taken out when you are up and walking.  If your blood type is Rh negative and your baby's blood type is Rh positive, you will be given a shot of anti-D immune globulin. This shot prevents you from having Rh problems with a future pregnancy. You should get the shot even if you had your tubes tied (tubal ligation).  If you are allowed to take the baby for a walk, place the baby in the bassinet and push it.   This information is not intended to replace advice given to you by your health care provider. Make sure you discuss any questions you have with your health care provider.   Document Released: 07/12/2005 Document Revised: 04/02/2015 Document Reviewed: 03/08/2012 Elsevier Interactive Patient Education 2016 [employer]  

## 2016-04-27 ENCOUNTER — Ambulatory Visit (INDEPENDENT_AMBULATORY_CARE_PROVIDER_SITE_OTHER): Payer: BLUE CROSS/BLUE SHIELD | Admitting: Obstetrics and Gynecology

## 2016-04-27 VITALS — BP 115/74 | HR 93 | Wt 163.9 lb

## 2016-04-27 DIAGNOSIS — Z23 Encounter for immunization: Secondary | ICD-10-CM | POA: Diagnosis not present

## 2016-04-27 DIAGNOSIS — Z3493 Encounter for supervision of normal pregnancy, unspecified, third trimester: Secondary | ICD-10-CM

## 2016-04-27 LAB — POCT URINALYSIS DIPSTICK
BILIRUBIN UA: NEGATIVE
Blood, UA: NEGATIVE
Glucose, UA: NEGATIVE
KETONES UA: NEGATIVE
Leukocytes, UA: NEGATIVE
Nitrite, UA: NEGATIVE
PROTEIN UA: NEGATIVE
SPEC GRAV UA: 1.015
Urobilinogen, UA: 0.2
pH, UA: 7

## 2016-04-27 NOTE — Progress Notes (Signed)
ROB-pt is having some pelvic pressure, flu vaccine given

## 2016-04-27 NOTE — Progress Notes (Signed)
ROB- flu vaccine given; repeat c/s planned for 11/3

## 2016-05-11 ENCOUNTER — Encounter: Payer: Self-pay | Admitting: *Deleted

## 2016-05-11 ENCOUNTER — Inpatient Hospital Stay
Admission: EM | Admit: 2016-05-11 | Discharge: 2016-05-11 | Disposition: A | Discharge status: Home / Self Care | Payer: Self-pay | Attending: Obstetrics and Gynecology | Admitting: Obstetrics and Gynecology

## 2016-05-11 ENCOUNTER — Ambulatory Visit (INDEPENDENT_AMBULATORY_CARE_PROVIDER_SITE_OTHER): Payer: BLUE CROSS/BLUE SHIELD | Admitting: Obstetrics and Gynecology

## 2016-05-11 VITALS — BP 115/76 | HR 86 | Wt 168.7 lb

## 2016-05-11 DIAGNOSIS — O09899 Supervision of other high risk pregnancies, unspecified trimester: Secondary | ICD-10-CM

## 2016-05-11 DIAGNOSIS — Z283 Underimmunization status: Secondary | ICD-10-CM

## 2016-05-11 DIAGNOSIS — Z2839 Other underimmunization status: Secondary | ICD-10-CM

## 2016-05-11 DIAGNOSIS — Z3493 Encounter for supervision of normal pregnancy, unspecified, third trimester: Secondary | ICD-10-CM

## 2016-05-11 DIAGNOSIS — Z3A00 Weeks of gestation of pregnancy not specified: Secondary | ICD-10-CM | POA: Insufficient documentation

## 2016-05-11 DIAGNOSIS — O9989 Other specified diseases and conditions complicating pregnancy, childbirth and the puerperium: Secondary | ICD-10-CM

## 2016-05-11 DIAGNOSIS — Z3685 Encounter for antenatal screening for Streptococcus B: Secondary | ICD-10-CM

## 2016-05-11 DIAGNOSIS — Z113 Encounter for screening for infections with a predominantly sexual mode of transmission: Secondary | ICD-10-CM

## 2016-05-11 LAB — POCT URINALYSIS DIPSTICK
Bilirubin, UA: NEGATIVE
Glucose, UA: NEGATIVE
Ketones, UA: NEGATIVE
Leukocytes, UA: NEGATIVE
Nitrite, UA: NEGATIVE
PROTEIN UA: NEGATIVE
RBC UA: NEGATIVE
SPEC GRAV UA: 1.015
UROBILINOGEN UA: 0.2
pH, UA: 6.5

## 2016-05-11 NOTE — Progress Notes (Signed)
ROB- cultures obtained, pt is having some pelvic pressure 

## 2016-05-11 NOTE — Progress Notes (Signed)
ROB- doing well, will do TOLAC if labors before c/s date on 11/3, cultures obtained and labor precautions discussed.

## 2016-05-11 NOTE — Discharge Instructions (Signed)
Discharge instructions reviewed with patient, pt. Verbalized understanding, labor precautions given to patient.  Pt. Signed copy and one copy given.  Spouse at the bedside.  Pt. Discharged to home.

## 2016-05-11 NOTE — OB Triage Note (Signed)
Pt. Here for labor evaluation.  Positive for fetal movement, denies gush of fluid, denies vaginal bleeding.

## 2016-05-13 LAB — GC/CHLAMYDIA PROBE AMP
Chlamydia trachomatis, NAA: NEGATIVE
NEISSERIA GONORRHOEAE BY PCR: NEGATIVE

## 2016-05-13 LAB — STREP GP B NAA: STREP GROUP B AG: NEGATIVE

## 2016-05-19 ENCOUNTER — Encounter: Payer: Self-pay | Admitting: *Deleted

## 2016-05-19 ENCOUNTER — Ambulatory Visit (INDEPENDENT_AMBULATORY_CARE_PROVIDER_SITE_OTHER): Payer: BLUE CROSS/BLUE SHIELD | Admitting: Obstetrics and Gynecology

## 2016-05-19 ENCOUNTER — Inpatient Hospital Stay
Admission: EM | Admit: 2016-05-19 | Discharge: 2016-05-21 | DRG: 775 | Disposition: A | Discharge status: Home / Self Care | Payer: BLUE CROSS/BLUE SHIELD | Attending: Obstetrics and Gynecology | Admitting: Obstetrics and Gynecology

## 2016-05-19 VITALS — BP 130/74 | HR 83 | Wt 172.1 lb

## 2016-05-19 DIAGNOSIS — O9081 Anemia of the puerperium: Secondary | ICD-10-CM | POA: Diagnosis not present

## 2016-05-19 DIAGNOSIS — O09899 Supervision of other high risk pregnancies, unspecified trimester: Secondary | ICD-10-CM

## 2016-05-19 DIAGNOSIS — Z3A38 38 weeks gestation of pregnancy: Secondary | ICD-10-CM | POA: Diagnosis not present

## 2016-05-19 DIAGNOSIS — Z3493 Encounter for supervision of normal pregnancy, unspecified, third trimester: Secondary | ICD-10-CM

## 2016-05-19 DIAGNOSIS — Z98891 History of uterine scar from previous surgery: Secondary | ICD-10-CM

## 2016-05-19 DIAGNOSIS — Z23 Encounter for immunization: Secondary | ICD-10-CM

## 2016-05-19 DIAGNOSIS — Z3483 Encounter for supervision of other normal pregnancy, third trimester: Secondary | ICD-10-CM | POA: Diagnosis present

## 2016-05-19 DIAGNOSIS — O9989 Other specified diseases and conditions complicating pregnancy, childbirth and the puerperium: Secondary | ICD-10-CM

## 2016-05-19 DIAGNOSIS — Z833 Family history of diabetes mellitus: Secondary | ICD-10-CM | POA: Diagnosis not present

## 2016-05-19 DIAGNOSIS — D649 Anemia, unspecified: Secondary | ICD-10-CM | POA: Diagnosis not present

## 2016-05-19 DIAGNOSIS — O34211 Maternal care for low transverse scar from previous cesarean delivery: Secondary | ICD-10-CM | POA: Diagnosis present

## 2016-05-19 DIAGNOSIS — Z2839 Other underimmunization status: Secondary | ICD-10-CM

## 2016-05-19 DIAGNOSIS — O34219 Maternal care for unspecified type scar from previous cesarean delivery: Secondary | ICD-10-CM

## 2016-05-19 DIAGNOSIS — Z283 Underimmunization status: Secondary | ICD-10-CM

## 2016-05-19 DIAGNOSIS — O99019 Anemia complicating pregnancy, unspecified trimester: Secondary | ICD-10-CM | POA: Diagnosis present

## 2016-05-19 LAB — CBC
HCT: 33.3 % — ABNORMAL LOW (ref 35.0–47.0)
HEMOGLOBIN: 10.8 g/dL — AB (ref 12.0–16.0)
MCH: 24.9 pg — AB (ref 26.0–34.0)
MCHC: 32.5 g/dL (ref 32.0–36.0)
MCV: 76.6 fL — ABNORMAL LOW (ref 80.0–100.0)
PLATELETS: 267 10*3/uL (ref 150–440)
RBC: 4.35 MIL/uL (ref 3.80–5.20)
RDW: 12.9 % (ref 11.5–14.5)
WBC: 13.2 10*3/uL — ABNORMAL HIGH (ref 3.6–11.0)

## 2016-05-19 LAB — RAPID HIV SCREEN (HIV 1/2 AB+AG)
HIV 1/2 Antibodies: NONREACTIVE
HIV-1 P24 Antigen - HIV24: NONREACTIVE

## 2016-05-19 LAB — TYPE AND SCREEN
ABO/RH(D): A POS
ANTIBODY SCREEN: NEGATIVE

## 2016-05-19 LAB — POCT URINALYSIS DIPSTICK
BILIRUBIN UA: NEGATIVE
GLUCOSE UA: NEGATIVE
Ketones, UA: NEGATIVE
Nitrite, UA: NEGATIVE
Protein, UA: NEGATIVE
RBC UA: NEGATIVE
SPEC GRAV UA: 1.01
Urobilinogen, UA: NEGATIVE
pH, UA: 8

## 2016-05-19 MED ORDER — ONDANSETRON HCL 4 MG PO TABS: 4.0000 mg | ORAL_TABLET | ORAL | Status: DC | PRN

## 2016-05-19 MED ORDER — OXYTOCIN BOLUS FROM INFUSION
500.0000 mL | Freq: Once | INTRAVENOUS | Status: AC
Start: 2016-05-19 — End: 2016-05-19
  Administered 2016-05-19: 500 mL via INTRAVENOUS

## 2016-05-19 MED ORDER — LIDOCAINE HCL (PF) 1 % IJ SOLN
INTRAMUSCULAR | Status: AC
  Administered 2016-05-19: 10 mL via SUBCUTANEOUS
  Filled 2016-05-19: qty 30

## 2016-05-19 MED ORDER — LACTATED RINGERS IV SOLN
INTRAVENOUS | Status: DC
  Administered 2016-05-19: 18:00:00 via INTRAVENOUS

## 2016-05-19 MED ORDER — OXYTOCIN 10 UNIT/ML IJ SOLN
INTRAMUSCULAR | Status: AC
  Filled 2016-05-19: qty 2

## 2016-05-19 MED ORDER — IBUPROFEN 600 MG PO TABS
600.0000 mg | ORAL_TABLET | Freq: Four times a day (QID) | ORAL | Status: DC
  Administered 2016-05-19: 600 mg via ORAL

## 2016-05-19 MED ORDER — ONDANSETRON HCL 4 MG/2ML IJ SOLN: 4.0000 mg | INTRAMUSCULAR | Status: DC | PRN

## 2016-05-19 MED ORDER — DIPHENHYDRAMINE HCL 25 MG PO CAPS: 25.0000 mg | ORAL_CAPSULE | Freq: Four times a day (QID) | ORAL | Status: DC | PRN

## 2016-05-19 MED ORDER — SIMETHICONE 80 MG PO CHEW: 80.0000 mg | CHEWABLE_TABLET | ORAL | Status: DC | PRN

## 2016-05-19 MED ORDER — SOD CITRATE-CITRIC ACID 500-334 MG/5ML PO SOLN
30.0000 mL | ORAL | Status: DC | PRN
  Filled 2016-05-19: qty 30

## 2016-05-19 MED ORDER — SENNOSIDES-DOCUSATE SODIUM 8.6-50 MG PO TABS
2.0000 | ORAL_TABLET | ORAL | Status: DC
  Administered 2016-05-20 – 2016-05-21 (×2): 2 via ORAL
  Filled 2016-05-19 (×3): qty 2

## 2016-05-19 MED ORDER — ACETAMINOPHEN 325 MG PO TABS: 650.0000 mg | ORAL_TABLET | ORAL | Status: DC | PRN

## 2016-05-19 MED ORDER — WITCH HAZEL-GLYCERIN EX PADS
1.0000 | MEDICATED_PAD | CUTANEOUS | Status: DC | PRN
Start: 2016-05-19 — End: 2016-05-21

## 2016-05-19 MED ORDER — LACTATED RINGERS IV SOLN: INTRAVENOUS | Status: DC

## 2016-05-19 MED ORDER — IBUPROFEN 600 MG PO TABS
ORAL_TABLET | ORAL | Status: AC
  Filled 2016-05-19: qty 1

## 2016-05-19 MED ORDER — ONDANSETRON HCL 4 MG/2ML IJ SOLN: 4.0000 mg | Freq: Four times a day (QID) | INTRAMUSCULAR | Status: DC | PRN

## 2016-05-19 MED ORDER — DIBUCAINE 1 % RE OINT: 1.0000 "application " | TOPICAL_OINTMENT | RECTAL | Status: DC | PRN

## 2016-05-19 MED ORDER — OXYTOCIN 40 UNITS IN LACTATED RINGERS INFUSION - SIMPLE MED
2.5000 [IU]/h | INTRAVENOUS | Status: DC
  Filled 2016-05-19: qty 1000

## 2016-05-19 MED ORDER — ZOLPIDEM TARTRATE 5 MG PO TABS: 5.0000 mg | ORAL_TABLET | Freq: Every evening | ORAL | Status: DC | PRN

## 2016-05-19 MED ORDER — LIDOCAINE 5 % EX PTCH: 1.0000 | MEDICATED_PATCH | CUTANEOUS | Status: AC

## 2016-05-19 MED ORDER — OXYCODONE-ACETAMINOPHEN 5-325 MG PO TABS: 2.0000 | ORAL_TABLET | ORAL | Status: DC | PRN

## 2016-05-19 MED ORDER — DEXTROSE 5 % IV SOLN
2.0000 g | INTRAVENOUS | Status: DC
  Filled 2016-05-19: qty 2

## 2016-05-19 MED ORDER — COCONUT OIL OIL: 1.0000 "application " | TOPICAL_OIL | Status: DC | PRN

## 2016-05-19 MED ORDER — IBUPROFEN 600 MG PO TABS
600.0000 mg | ORAL_TABLET | Freq: Four times a day (QID) | ORAL | Status: DC
  Administered 2016-05-20 – 2016-05-21 (×6): 600 mg via ORAL
  Filled 2016-05-19 (×6): qty 1

## 2016-05-19 MED ORDER — OXYCODONE-ACETAMINOPHEN 5-325 MG PO TABS: 1.0000 | ORAL_TABLET | ORAL | Status: DC | PRN

## 2016-05-19 MED ORDER — LACTATED RINGERS IV SOLN: 500.0000 mL | INTRAVENOUS | Status: DC | PRN

## 2016-05-19 MED ORDER — BUTORPHANOL TARTRATE 1 MG/ML IJ SOLN: 1.0000 mg | INTRAMUSCULAR | Status: DC | PRN

## 2016-05-19 MED ORDER — MISOPROSTOL 200 MCG PO TABS
ORAL_TABLET | ORAL | Status: AC
  Filled 2016-05-19: qty 4

## 2016-05-19 MED ORDER — AMMONIA AROMATIC IN INHA
RESPIRATORY_TRACT | Status: AC
  Filled 2016-05-19: qty 10

## 2016-05-19 MED ORDER — LIDOCAINE HCL (PF) 1 % IJ SOLN
30.0000 mL | INTRAMUSCULAR | Status: DC | PRN
  Administered 2016-05-19: 10 mL via SUBCUTANEOUS

## 2016-05-19 MED ORDER — PRENATAL MULTIVITAMIN CH
1.0000 | ORAL_TABLET | Freq: Every day | ORAL | Status: DC
  Administered 2016-05-20 – 2016-05-21 (×2): 1 via ORAL
  Filled 2016-05-19 (×3): qty 1

## 2016-05-19 MED ORDER — BENZOCAINE-MENTHOL 20-0.5 % EX AERO
1.0000 "application " | INHALATION_SPRAY | CUTANEOUS | Status: DC | PRN
  Administered 2016-05-20: 1 via TOPICAL
  Filled 2016-05-19 (×2): qty 56

## 2016-05-19 NOTE — H&P (Signed)
Obstetric History and Physical  Jessica Frederick is a 30 y.o. G3P1011 with IUP at [redacted]w[redacted]d presenting for complaints of a gush of bright red blood.  Patient was seen in the office today where she had a cervical exam.  Noted some spotting initially after exam, but then notes that 2 hrs later she had a gush of bright red blood. Patient states she has been having  irregular contractions (not timing),  intact membranes, with active fetal movement.    Prenatal Course Source of Care: [facility] with onset of care at 9 weeks with midwife [facility] [place], [place])  Pregnancy complications or risks: Patient Active Problem List   Diagnosis Date Noted  . Maternal varicella, non-immune 10/30/2015  . Rubella non-immune status, antepartum 10/29/2015  . History of cesarean section 10/08/2015  . Acid reflux 11/30/2013  . Arrest of descent, delivered, current hospitalization 07/21/2011   She plans to breastfeed She desires oral contraceptives (estrogen/progesterone) for postpartum contraception.   Prenatal labs and studies: ABO, Rh: A/Positive/-- (04/04 0934) Antibody: Negative (04/04 0934) Rubella: <20.0 (04/04 0934) RPR: Non Reactive (04/04 0934)  HBsAg: Negative (04/04 0934)  HIV: Non Reactive (04/04 0934)  ZOX:WRUEAVWU (10/17 1157) 1 hr Glucola  normal Genetic screening normal Anatomy US normal   OB History  Gravida Para Term Preterm AB Living  3 1 1   1 1   SAB TAB Ectopic Multiple Live Births  1       1    # Outcome Date GA Lbr Len/2nd Weight Sex Delivery Anes PTL Lv  3 Current           2 Term 07/21/11 [redacted]w[redacted]d 05:55 / 03:39 7 lb 6.5 oz (3.36 kg) M CS-LTranv Spinal  LIV  1 SAB 2012              Past Medical History:  Diagnosis Date  . Right ovarian cyst     Past Surgical History:  Procedure Laterality Date  . CESAREAN SECTION  07/21/2011   Procedure: CESAREAN SECTION;  Surgeon: Zenaida Niece, MD;  Location: [facility];  Service: Gynecology;  Laterality: N/A;  Primary  cesarean section    Family History  Problem Relation Age of Onset  . Diabetes Mother   . Breast cancer Paternal Grandmother   . Colon cancer Neg Hx   . Ovarian cancer Neg Hx   . Heart disease Neg Hx     Social History   Social History  . Marital status: Married    Spouse name: N/A  . Number of children: N/A  . Years of education: N/A   Social History Main Topics  . Smoking status: Never Smoker  . Smokeless tobacco: Never Used  . Alcohol use No  . Drug use: No  . Sexual activity: Yes    Birth control/ protection: Pill     Comment: mirena   Other Topics Concern  . None   Social History Narrative  . None    Prescriptions Prior to Admission  Medication Sig Dispense Refill Last Dose  . Prenatal Vit-Fe Fumarate-FA (PRENATAL MULTIVITAMIN) TABS Take 1 tablet by mouth daily.     Taking    No Known Allergies  Review of Systems: Negative except for what is mentioned in HPI.  Physical Exam: BP 122/75   Pulse (!) 107   Temp 98 F (36.7 C) (Axillary)   Resp 18   Ht 5' (1.524 m)   Wt 172 lb (78 kg)   LMP 08/26/2015 (Approximate)   BMI  33.59 kg/m  CONSTITUTIONAL: Well-developed, well-nourished female in no acute distress.  HENT:  Normocephalic, atraumatic, External right and left ear normal. Oropharynx is clear and moist EYES: Conjunctivae and EOM are normal. Pupils are equal, round, and reactive to light. No scleral icterus.  NECK: Normal range of motion, supple, no masses SKIN: Skin is warm and dry. No rash noted. Not diaphoretic. No erythema. No pallor. NEUROLOGIC: Alert and oriented to person, place, and time. Normal reflexes, muscle tone coordination. No cranial nerve deficit noted. PSYCHIATRIC: Normal mood and affect. Normal behavior. Normal judgment and thought content. CARDIOVASCULAR: Normal heart rate noted, regular rhythm RESPIRATORY: Effort and breath sounds normal, no problems with respiration noted ABDOMEN: Soft, nontender, nondistended,  gravid. MUSCULOSKELETAL: Normal range of motion. No edema and no tenderness. 2+ distal pulses.  Cervical Exam: Dilatation 6 cm   Effacement 100%   Station -3  (changed from 4/70/-3 at initial presentation)   Presentation: cephalic FHT:  Baseline rate 135 bpm   Variability moderate  Accelerations present   Decelerations none Contractions: Every 2-4 mins   Pertinent Labs/Studies:   Results for orders placed or performed in visit on 05/19/16 (from the past 24 hour(s))  POCT urinalysis dipstick     Status: Abnormal   Collection Time: 05/19/16 10:10 AM  Result Value Ref Range   Color, UA yellow    Clarity, UA clear    Glucose, UA neg    Bilirubin, UA neg    Ketones, UA neg    Spec Grav, UA 1.010    Blood, UA neg    pH, UA 8.0    Protein, UA neg    Urobilinogen, UA negative    Nitrite, UA neg    Leukocytes, UA small (1+) (A) Negative    Assessment : Jessica Frederick is a 30 y.o. G3P1011 at 7433w1d being admitted for labor.  Prior h/o C-section x 1, desiring TOLAC.  Plan: Labor: Expectant management. Augmentation as needed, per protocol.  Admission labs ordered. FWB: Reassuring fetal heart tracing.  GBS negative Delivery plan: Hopeful for vaginal delivery   Hildred LaserAnika Dodi Leu, MD [facility] 05/19/2016 4:38 PM

## 2016-05-19 NOTE — Progress Notes (Signed)
ROB: Doing well.  Still noting irregular ctx but not painful. Labor precautions reiterated. Can consider IOL (AROM) and trial of labor if no delivery by scheduled C-section date.

## 2016-05-19 NOTE — OB Triage Note (Signed)
G3P1 presents at 1375w1d c/o leaking fluid since 1230. Felt a gush of fluid while sitting on the toilet and noticed bright red blood on tissue. +FM, occasional contractions.

## 2016-05-19 NOTE — Progress Notes (Signed)
Intrapartum Progress Note  S: Patient denies complaints. Mild discomfort with ctx.  O: Blood pressure 124/78, pulse (!) 107, temperature 98 F (36.7 C), temperature source Axillary, resp. rate 18, height 5' (1.524 m), weight 172 lb (78 kg), last menstrual period 08/26/2015. Gen App: NAD, comfortable Abdomen: soft, gravid FHT: baseline 140 bpm.  Accels present.  Decels absent. moderate in degree variability.   Tocometer: contractions q 2-3 minutes Cervix: 6/80-90/c/-3 Extremities: Nontender, no edema.  Pitocin: None  Labs: Pending   Assessment:  1: SIUP at 968w1d 2. Prior C-section x 1 desiring TOLAC  Plan:  1. AROM'd with clear fluid 2. Anticipate vaginal delivery  Jessica LaserAnika Sherrell Farish, MD 05/19/2016 5:53 PM

## 2016-05-20 DIAGNOSIS — O99019 Anemia complicating pregnancy, unspecified trimester: Secondary | ICD-10-CM | POA: Diagnosis present

## 2016-05-20 LAB — CBC
HCT: 30.1 % — ABNORMAL LOW (ref 35.0–47.0)
Hemoglobin: 9.6 g/dL — ABNORMAL LOW (ref 12.0–16.0)
MCH: 25.1 pg — AB (ref 26.0–34.0)
MCHC: 32 g/dL (ref 32.0–36.0)
MCV: 78.3 fL — ABNORMAL LOW (ref 80.0–100.0)
PLATELETS: 208 10*3/uL (ref 150–440)
RBC: 3.84 MIL/uL (ref 3.80–5.20)
RDW: 13 % (ref 11.5–14.5)
WBC: 12.4 10*3/uL — ABNORMAL HIGH (ref 3.6–11.0)

## 2016-05-20 LAB — RUBELLA SCREEN: RUBELLA: 3.92 {index} (ref >0.99)

## 2016-05-20 LAB — VARICELLA ZOSTER ANTIBODY, IGG

## 2016-05-20 LAB — RPR: RPR: NONREACTIVE

## 2016-05-20 MED ORDER — FERROUS SULFATE 325 (65 FE) MG PO TABS: 325.0000 mg | ORAL_TABLET | Freq: Every day | ORAL | 1 refills | Status: DC

## 2016-05-20 MED ORDER — FERROUS SULFATE 325 (65 FE) MG PO TABS
325.0000 mg | ORAL_TABLET | Freq: Every day | ORAL | Status: DC
  Administered 2016-05-20 – 2016-05-21 (×2): 325 mg via ORAL
  Filled 2016-05-20 (×2): qty 1

## 2016-05-20 MED ORDER — DOCUSATE SODIUM 100 MG PO CAPS: 100.0000 mg | ORAL_CAPSULE | Freq: Every day | ORAL | 1 refills | Status: AC | PRN

## 2016-05-20 MED ORDER — IBUPROFEN 600 MG PO TABS: 600.0000 mg | ORAL_TABLET | Freq: Four times a day (QID) | ORAL | 0 refills | Status: AC

## 2016-05-20 MED ORDER — VARICELLA VIRUS VACCINE LIVE 1350 PFU/0.5ML IJ SUSR
0.5000 mL | INTRAMUSCULAR | Status: AC | PRN
  Administered 2016-05-21: 0.5 mL via SUBCUTANEOUS
  Filled 2016-05-20: qty 0.5

## 2016-05-20 NOTE — Progress Notes (Signed)
Post Partum Day # 1, s/p SVD (VBAC)  Subjective: no complaints, up ad lib, voiding and tolerating PO  Objective: Temp:  [97.9 F (36.6 C)-98.5 F (36.9 C)] 97.9 F (36.6 C) (10/26 0746) Pulse Rate:  [82-137] 89 (10/26 0746) Resp:  [18-20] 20 (10/26 0746) BP: (102-130)/(55-78) 113/70 (10/26 0746) SpO2:  [100 %] 100 % (10/26 0746) Weight:  [172 lb (78 kg)-172 lb 1.6 oz (78.1 kg)] 172 lb (78 kg) (10/25 1650)  Physical Exam:  General: alert and no distress  Lungs: clear to auscultation bilaterally Breasts: normal appearance, no masses or tenderness Heart: regular rate and rhythm, S1, S2 normal, no murmur, click, rub or gallop Pelvis: Lochia: appropriate, Uterine Fundus: firm Extremities: DVT Evaluation: No evidence of DVT seen on physical exam. Negative Homan's sign. No cords or calf tenderness. No significant calf/ankle edema.   Recent Labs  05/19/16 1715 05/20/16 0543  HGB 10.8* 9.6*  HCT 33.3* 30.1*    Assessment/Plan: Plan for discharge tomorrow, Breastfeeding, Circumcision prior to discharge and Contraception OCPs Mild anemia, will treat with PO iron supplementation.  Varicella non-immune, will give Varivax    LOS: 1 day   Hildred LaserAnika Oak Dorey, MD [facility]

## 2016-05-20 NOTE — Discharge Instructions (Signed)

## 2016-05-21 MED ORDER — FERROUS SULFATE 325 (65 FE) MG PO TABS: 325.0000 mg | ORAL_TABLET | Freq: Every day | ORAL | 1 refills | Status: DC

## 2016-05-21 MED ORDER — VARICELLA VIRUS VACCINE LIVE 1350 PFU/0.5ML IJ SUSR: 0.5000 mL | INTRAMUSCULAR | Status: DC | PRN

## 2016-05-21 NOTE — Progress Notes (Signed)
Patient discharge to home via wheelchair with spouse and baby in car seat.  

## 2016-05-21 NOTE — Discharge Summary (Signed)
Obstetric Discharge Summary Reason for Admission: onset of labor Prenatal Procedures: ultrasound Intrapartum Procedures: spontaneous vaginal delivery (VBAC) Postpartum Procedures: Varivax (Varicella vaccine) Complications-Operative and Postpartum: none Hemoglobin  Date Value Ref Range Status  05/20/2016 9.6 (L) 12.0 - 16.0 g/dL Final   HCT  Date Value Ref Range Status  05/20/2016 30.1 (L) 35.0 - 47.0 % Final   Hematocrit  Date Value Ref Range Status  03/09/2016 33.7 (L) 34.0 - 46.6 % Final    Physical Exam:  Blood pressure 106/64, pulse 83, temperature 98 F (36.7 C), temperature source Oral, resp. rate 18, height 5' (1.524 m), weight 172 lb (78 kg), last menstrual period 08/26/2015, SpO2 100 %, unknown if currently breastfeeding.  General: alert and no distress Lochia: appropriate Uterine Fundus: firm Incision: healing well DVT Evaluation: No evidence of DVT seen on physical exam. Negative Homan's sign. No cords or calf tenderness. No significant calf/ankle edema.  Discharge Diagnoses: Term Pregnancy-delivered and anemia postpartum (mild)  Discharge Information: Date: 05/21/2016 Activity: pelvic rest Diet: routine Medications: PNV, Ibuprofen, Colace and Iron Condition: stable Instructions: refer to practice specific booklet Discharge to: home Follow-up Information    Melody N Shambley, CNM Follow up in 6 week(s).   Specialties:  Obstetrics and Gynecology, Radiology Why:  Postpartum visit Contact information: [address] [place] [address] [phone]           Newborn Data: Live born female  Birth Weight: 6 lb 7.4 oz (2930 g) APGAR: 5, 9  Home with mother.  Dallas Torok M 05/21/2016, 10:17 AM

## 2016-05-26 ENCOUNTER — Encounter: Payer: BLUE CROSS/BLUE SHIELD | Admitting: Obstetrics and Gynecology

## 2016-05-27 ENCOUNTER — Other Ambulatory Visit: Payer: Self-pay

## 2016-05-28 ENCOUNTER — Inpatient Hospital Stay
Admission: RE | Admit: 2016-05-28 | Payer: BLUE CROSS/BLUE SHIELD | Source: Ambulatory Visit | Admitting: Obstetrics and Gynecology

## 2016-05-28 ENCOUNTER — Encounter: Admission: RE | Payer: Self-pay | Source: Ambulatory Visit

## 2016-05-28 SURGERY — Surgical Case
Anesthesia: General

## 2016-07-22 ENCOUNTER — Encounter: Payer: Self-pay | Admitting: Obstetrics and Gynecology

## 2016-07-22 ENCOUNTER — Ambulatory Visit (INDEPENDENT_AMBULATORY_CARE_PROVIDER_SITE_OTHER): Payer: BLUE CROSS/BLUE SHIELD | Admitting: Obstetrics and Gynecology

## 2016-07-22 NOTE — Progress Notes (Signed)
  Subjective:     Jessica LowensteinBrittney O Frederick is a 30 y.o. female who presents for a postpartum visit. She is 8 weeks postpartum following a spontaneous vaginal delivery. I have fully reviewed the prenatal and intrapartum course. The delivery was at 39 gestational weeks. Outcome: vaginal birth after cesarean (VBAC). Anesthesia: epidural. Postpartum course has been uncomplicated. Baby's course has been uncomplicated. Baby is feeding by both breast and bottle - Similac Advance. Bleeding no bleeding. Bowel function is normal. Bladder function is normal. Patient is not sexually active. Contraception method is abstinence. Postpartum depression screening: negative.  The following portions of the patient's history were reviewed and updated as appropriate: allergies, current medications, past family history, past medical history, past social history, past surgical history and problem list.  Review of Systems A comprehensive review of systems was negative.   Objective:    BP 105/75   Pulse 92   Ht 5' (1.524 m)   Wt 144 lb 6.4 oz (65.5 kg)   Breastfeeding? Yes   BMI 28.20 kg/m   General:  alert, cooperative and appears stated age   Breasts:  inspection negative, no nipple discharge or bleeding, no masses or nodularity palpable  Lungs: clear to auscultation bilaterally  Heart:  regular rate and rhythm, S1, S2 normal, no murmur, click, rub or gallop  Abdomen: soft, non-tender; bowel sounds normal; no masses,  no organomegaly   Vulva:  normal  Vagina: normal vagina, no discharge, exudate, lesion, or erythema  Cervix:  multiparous appearance  Corpus: normal size, contour, position, consistency, mobility, non-tender  Adnexa:  no mass, fullness, tenderness  Rectal Exam: Not performed.        Assessment:     8 weeks postpartum exam. Pap smear not done at today's visit.   Plan:    1. Contraception: condoms  3. Follow up in: 3 months AE or as needed.

## 2016-07-22 NOTE — Patient Instructions (Signed)
  Place postpartum visit patient instructions here.  

## 2016-07-23 ENCOUNTER — Telehealth: Payer: Self-pay | Admitting: *Deleted

## 2016-07-23 ENCOUNTER — Other Ambulatory Visit: Payer: Self-pay | Admitting: Obstetrics and Gynecology

## 2016-07-23 DIAGNOSIS — E559 Vitamin D deficiency, unspecified: Secondary | ICD-10-CM | POA: Insufficient documentation

## 2016-07-23 LAB — CBC
HEMOGLOBIN: 12.7 g/dL (ref 11.1–15.9)
Hematocrit: 39.3 % (ref 34.0–46.6)
MCH: 25.1 pg — AB (ref 26.6–33.0)
MCHC: 32.3 g/dL (ref 31.5–35.7)
MCV: 78 fL — AB (ref 79–97)
Platelets: 293 10*3/uL (ref 150–379)
RBC: 5.05 x10E6/uL (ref 3.77–5.28)
RDW: 16.6 % — ABNORMAL HIGH (ref 12.3–15.4)
WBC: 6.6 10*3/uL (ref 3.4–10.8)

## 2016-07-23 LAB — VITAMIN D 25 HYDROXY (VIT D DEFICIENCY, FRACTURES): VIT D 25 HYDROXY: 27.4 ng/mL — AB (ref 30.0–100.0)

## 2016-07-23 LAB — IRON: Iron: 40 ug/dL (ref 27–159)

## 2016-07-23 MED ORDER — VITAMIN D (ERGOCALCIFEROL) 1.25 MG (50000 UNIT) PO CAPS: 50000.0000 [IU] | ORAL_CAPSULE | ORAL | 1 refills | Status: AC

## 2016-07-23 NOTE — Telephone Encounter (Signed)
-----   Message from Pur[place] NailsMelody N Shambley, [place] sent at 07/23/2016 11:42 AM EST ----- Please mail info on vit D def.

## 2016-07-23 NOTE — Telephone Encounter (Signed)
Notified pt mailed info 

## 2016-10-20 ENCOUNTER — Encounter: Payer: BLUE CROSS/BLUE SHIELD | Admitting: Obstetrics and Gynecology
